# Patient Record
Sex: Female | Born: 2000 | Race: Black or African American | Hispanic: No | Marital: Single | Smoking: Current every day smoker
Health system: Southern US, Community
[De-identification: ages and names within clinical notes are randomized; demographics above are authoritative.]

## PROBLEM LIST (undated history)

## (undated) HISTORY — PX: TONSILLECTOMY: SUR1361

---
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
## 2020-06-13 ENCOUNTER — Ambulatory Visit (HOSPITAL_COMMUNITY)
Admission: EM | Admit: 2020-06-13 | Discharge: 2020-06-14 | Disposition: A | Discharge status: Home / Self Care | Payer: 59 | Attending: Nurse Practitioner | Admitting: Nurse Practitioner

## 2020-06-13 ENCOUNTER — Other Ambulatory Visit: Payer: Self-pay

## 2020-06-13 DIAGNOSIS — Z20822 Contact with and (suspected) exposure to covid-19: Secondary | ICD-10-CM | POA: Insufficient documentation

## 2020-06-13 DIAGNOSIS — Z9151 Personal history of suicidal behavior: Secondary | ICD-10-CM | POA: Insufficient documentation

## 2020-06-13 DIAGNOSIS — Z733 Stress, not elsewhere classified: Secondary | ICD-10-CM | POA: Insufficient documentation

## 2020-06-13 DIAGNOSIS — F332 Major depressive disorder, recurrent severe without psychotic features: Secondary | ICD-10-CM | POA: Insufficient documentation

## 2020-06-13 NOTE — BH Assessment (Addendum)
Comprehensive Clinical Assessment (CCA) Note  06/14/2020 Darlene King [id number]   Disposition: [name], NP, recommends overnight observation for safety and stabilization with psych reassessment in the AM.   The patient demonstrates the following risk factors for suicide: Chronic risk factors for suicide include: previous suicide attempts attempted overdose 2 years ago. Acute risk factors for suicide include: social withdrawal/isolation. Protective factors for this patient include: positive social support and hope for the future. Considering these factors, the overall suicide risk at this point appears to be high. Patient is appropriate for outpatient follow up.  No prior history populated.   1:1 high risk sitter  Darlene King is a 19 year old female presenting voluntarily to [facility] due to SI with plan to cut self with razor. Patient reported being SI within the past 2 hours. Patient reported calling Suicide Line and sharing with them feelings of depression and SI. Police were notified and brought patient into [facility]. Patient reported onset was 05/15/20, grief/loss after abortion. Patient reported parents and friends new about abortion and are supportive. Patient reported worsening depressive symptoms. Patient reported suicide attempt 2 years ago of attempted overdose. Patient denied prior inpatient psych hospitalizations. Patient denied receiving outpatient mental health treatment and is not received any prescription medications.   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 Patient reported good grades in school. Patient currently lives on campus with 1 roommate. Patient reported getting along well with family members. Patient denied access to guns. Patient was cooperative during assessment.   Chief Complaint:  Chief Complaint  Patient presents with  . Suicidal   Visit Diagnosis: Major depressive disorder  CCA Screening, Triage and  Referral (STR)  Patient Reported Information How did you hear about [place]? School/University  Referral name: No data recorded Referral phone number: No data recorded  Whom do you see for routine medical problems? No data recorded Practice/Facility Name: No data recorded Practice/Facility Phone Number: No data recorded Name of Contact: No data recorded Contact Number: No data recorded Contact Fax Number: No data recorded Prescriber Name: No data recorded Prescriber Address (if known): No data recorded  What Is the Reason for Your Visit/Call Today? SI with plan to cut self with razor.  How Long Has This Been Causing You Problems? 1 wk - 1 month  What Do You Feel Would Help You the Most Today? Treatment for Depression or other mood problem  Have You Recently Been in Any Inpatient Treatment (Hospital/Detox/Crisis Center/28-Day Program)? No  Name/Location of Program/Hospital:No data recorded How Long Were You There? No data recorded When Were You Discharged? No data recorded  Have You Ever Received Services From [facility] Before? No  Who Do You See at [facility]? No data recorded  Have You Recently Had Any Thoughts About Hurting Yourself? Yes  Are You Planning to Commit Suicide/Harm Yourself At This time? No  Have you Recently Had Thoughts About Hurting Someone Darlene King? No  Explanation: No data recorded  Have You Used Any Alcohol or Drugs in the Past 24 Hours? No  How Long Ago Did You Use Drugs or Alcohol? No data recorded What Did You Use and How Much? No data recorded  Do You Currently Have a Therapist/Psychiatrist? No  Name of Therapist/Psychiatrist: No data recorded  Have You Been Recently Discharged From Any Office Practice or Programs? No  Explanation of Discharge From Practice/Program: No data recorded  CCA Screening Triage Referral Assessment Type of Contact: Face-to-Face  Is this  Initial or Reassessment? No data recorded Date Telepsych consult ordered in  [facility]:  No data recorded Time Telepsych consult ordered in [facility]:  No data recorded  Patient Reported Information Reviewed? Yes  Patient Left Without Being Seen? No data recorded Reason for Not Completing Assessment: No data recorded  Collateral Involvement: none reported  Does Patient Have a Court Appointed Legal Guardian? No data recorded Name and Contact of Legal Guardian: No data recorded If Minor and Not Living with Parent(s), Who has Custody? No data recorded Is CPS involved or ever been involved? Never  Is APS involved or ever been involved? Never  Patient Determined To Be At Risk for Harm To Self or Others Based on Review of Patient Reported Information or Presenting Complaint? Yes, for Self-Harm  Method: No data recorded Availability of Means: No data recorded Intent: No data recorded Notification Required: No data recorded Additional Information for Danger to Others Potential: No data recorded Additional Comments for Danger to Others Potential: No data recorded Are There Guns or Other Weapons in Your Home? No data recorded Types of Guns/Weapons: No data recorded Are These Weapons Safely Secured?                            No data recorded Who Could Verify You Are Able To Have These Secured: No data recorded Do You Have any Outstanding Charges, Pending Court Dates, Parole/Probation? No data recorded Contacted To Inform of Risk of Harm To Self or Others: No data recorded  [facility]  Does Patient Present under Involuntary Commitment? No  IVC Papers Initial File Date: No data recorded  [place]  Patient Currently Receiving the Following Services: Not Receiving Services  Determination of Need: Urgent (48 hours)  Options For Referral: Outpatient Therapy; Medication Management  CCA Biopsychosocial Intake/Chief Complaint:  SI with plan to cut self with razor.  Current Symptoms/Problems: Worsening depressive  symptoms.  Patient Reported Schizophrenia/Schizoaffective Diagnosis in Past: No  Strengths: self-awareness  Preferences: No data recorded Abilities: No data recorded  Type of Services Patient Feels are Needed: outpatient therapy  Initial Clinical Notes/Concerns: No data recorded  Mental Health Symptoms Depression:  Hopelessness; Fatigue; Change in energy/activity; Sleep (too much or little); Tearfulness; Worthlessness; Increase/decrease in appetite   Duration of Depressive symptoms: Greater than two weeks   Mania:  None   Anxiety:   Worrying   Psychosis:  None   Duration of Psychotic symptoms: No data recorded  Trauma:  Guilt/shame   Obsessions:  None   Compulsions:  None   Inattention:  None   Hyperactivity/Impulsivity:  N/A   Oppositional/Defiant Behaviors:  None   Emotional Irregularity:  None   Other Mood/Personality Symptoms:  No data recorded   Mental Status Exam Appearance and self-care  Stature:  Average   Weight:  Average weight   Clothing:  Neat/clean   Grooming:  Normal   Cosmetic use:  Age appropriate   Posture/gait:  Normal   Motor activity:  Not Remarkable   Sensorium  Attention:  Normal   Concentration:  Normal   Orientation:  X5   Recall/memory:  Normal   Affect and Mood  Affect:  Appropriate; Anxious   Mood:  Depressed; Hopeless; Worthless   Relating  Eye contact:  Normal   Facial expression:  Depressed   Attitude toward examiner:  Cooperative   Thought and Language  Speech flow: Normal   Thought content:  Appropriate to Mood and Circumstances   Preoccupation:  None   Hallucinations:  None   Organization:  No data recorded  [employer] of Knowledge:  Average   Intelligence:  Average   Abstraction:  Normal   Judgement:  Normal   Reality Testing:  Adequate   Insight:  Fair   Decision Making:  No data recorded  Social Functioning  Social Maturity:  No data recorded  Social Judgement:   Normal   Stress  Stressors:  Grief/losses; Transitions; Other (Comment) (people)   Coping Ability:  Exhausted; Overwhelmed   Skill Deficits:  Self-control; Communication   Supports:  Family; Friends/Service system    Religion:   Leisure/Recreation: Leisure / Recreation Do You Have Hobbies?: Yes Leisure and Hobbies: painting, Microbiologist.  Exercise/Diet: Exercise/Diet Do You Follow a Special Diet?: No Do You Have Any Trouble Sleeping?: Yes Explanation of Sleeping Difficulties: 4-5  CCA Employment/Education Employment/Work Situation: Employment / Work Situation Employment situation: Surveyor, minerals job has been impacted by current illness: No Has patient ever been in the [employer]?: No  Education: Education Is Patient Currently Attending School?: Yes School Currently Attending: [employer] Last Grade Completed: 14 Name of [employer] School: n/a Did Garment/textile technologist From [employer]?: Yes Did Theme park manager?: Yes What Type of College Degree Do you Have?: [employer] Did [place] Attend Graduate School?: No 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, broadcasting/film/video Did You Have An Individualized Education Program (IIEP): No Did You Have Any Difficulty At [employer]?: No Patient's Education Has Been Impacted by Current Illness: No  CCA Family/Childhood History Family and Relationship History: Family history Does patient have children?: No  Childhood History:  Childhood History Additional childhood history information: uta Description of patient's relationship with caregiver when they were a child: good Patient's description of current relationship with people who raised him/her: good How were you disciplined when you got in trouble as a child/adolescent?: n/a Does patient have siblings?: Yes Number of Siblings: 2 Description of patient's current relationship with siblings: good Did patient suffer any verbal/emotional/physical/sexual abuse as a child?: No Did patient  suffer from severe childhood neglect?: No Has patient ever been sexually abused/assaulted/raped as an adolescent or adult?: No Was the patient ever a victim of a crime or a disaster?: No Witnessed domestic violence?: No Has patient been affected by domestic violence as an adult?: No  Child/Adolescent Assessment:   CCA Substance Use Alcohol/Drug Use: Alcohol / Drug Use Pain Medications: see MAR Prescriptions: see MAR Over the Counter: see MAR History of alcohol / drug use?: No history of alcohol / drug abuse   ASAM's:  Six Dimensions of Multidimensional Assessment  Dimension 1:  Acute Intoxication and/or Withdrawal Potential:      Dimension 2:  Biomedical Conditions and Complications:      Dimension 3:  Emotional, Behavioral, or Cognitive Conditions and Complications:     Dimension 4:  Readiness to Change:     Dimension 5:  Relapse, Continued use, or Continued Problem Potential:     Dimension 6:  Recovery/Living Environment:     ASAM Severity Score:    ASAM Recommended Level of Treatment:     Substance use Disorder (SUD)   Recommendations for Services/Supports/Treatments: Recommendations for Services/Supports/Treatments Recommendations For Services/Supports/Treatments: Medication Management,Individual Therapy  DSM5 Diagnoses: Patient Active Problem List   Diagnosis Date Noted  . Current moderate episode of major depressive disorder without prior episode (HCC)   . Nausea and vomiting 05/28/2019  . Nausea & vomiting  05/27/2019  . Normocytic anemia 05/27/2019   Patient Centered Plan: Patient is on the following Treatment Plan(s):   Referrals to Alternative Service(s): Referred to Alternative Service(s):   Place:   Date:   Time:    Referred to Alternative Service(s):   Place:   Date:   Time:    Referred to Alternative Service(s):   Place:   Date:   Time:    Referred to Alternative Service(s):   Place:   Date:   Time:     Burnetta Sabin, [facility]

## 2020-06-14 DIAGNOSIS — Z733 Stress, not elsewhere classified: Secondary | ICD-10-CM | POA: Diagnosis not present

## 2020-06-14 DIAGNOSIS — Z9151 Personal history of suicidal behavior: Secondary | ICD-10-CM | POA: Diagnosis not present

## 2020-06-14 DIAGNOSIS — R45851 Suicidal ideations: Secondary | ICD-10-CM | POA: Diagnosis present

## 2020-06-14 DIAGNOSIS — Z20822 Contact with and (suspected) exposure to covid-19: Secondary | ICD-10-CM | POA: Diagnosis not present

## 2020-06-14 DIAGNOSIS — F332 Major depressive disorder, recurrent severe without psychotic features: Secondary | ICD-10-CM | POA: Diagnosis not present

## 2020-06-14 DIAGNOSIS — F321 Major depressive disorder, single episode, moderate: Secondary | ICD-10-CM

## 2020-06-14 LAB — CBC WITH DIFFERENTIAL/PLATELET
Abs Immature Granulocytes: 0.01 10*3/uL (ref 0.00–0.07)
Basophils Absolute: 0 10*3/uL (ref 0.0–0.1)
Basophils Relative: 1 %
Eosinophils Absolute: 0.1 10*3/uL (ref 0.0–0.5)
Eosinophils Relative: 1 %
HCT: 40.9 % (ref 36.0–46.0)
Hemoglobin: 12.7 g/dL (ref 12.0–15.0)
Immature Granulocytes: 0 %
Lymphocytes Relative: 53 %
Lymphs Abs: 3.7 10*3/uL (ref 0.7–4.0)
MCH: 24.4 pg — ABNORMAL LOW (ref 26.0–34.0)
MCHC: 31.1 g/dL (ref 30.0–36.0)
MCV: 78.7 fL — ABNORMAL LOW (ref 80.0–100.0)
Monocytes Absolute: 0.5 10*3/uL (ref 0.1–1.0)
Monocytes Relative: 8 %
Neutro Abs: 2.5 10*3/uL (ref 1.7–7.7)
Neutrophils Relative %: 37 %
Platelets: 361 10*3/uL (ref 150–400)
RBC: 5.2 MIL/uL — ABNORMAL HIGH (ref 3.87–5.11)
RDW: 14.7 % (ref 11.5–15.5)
WBC: 6.8 10*3/uL (ref 4.0–10.5)
nRBC: 0 % (ref 0.0–0.2)

## 2020-06-14 LAB — POCT URINE DRUG SCREEN - MANUAL ENTRY (I-SCREEN)
POC Amphetamine UR: NOT DETECTED
POC Buprenorphine (BUP): NOT DETECTED
POC Cocaine UR: NOT DETECTED
POC Marijuana UR: POSITIVE — AB
POC Methadone UR: NOT DETECTED
POC Methamphetamine UR: NOT DETECTED
POC Morphine: NOT DETECTED
POC Oxazepam (BZO): NOT DETECTED
POC Oxycodone UR: NOT DETECTED
POC Secobarbital (BAR): NOT DETECTED

## 2020-06-14 LAB — COMPREHENSIVE METABOLIC PANEL
ALT: <5 U/L (ref 0–44)
AST: 22 U/L (ref 15–41)
Albumin: 4.3 g/dL (ref 3.5–5.0)
Alkaline Phosphatase: 39 U/L (ref 38–126)
Anion gap: 9 (ref 5–15)
BUN: 9 mg/dL (ref 6–20)
CO2: 24 mmol/L (ref 22–32)
Calcium: 9.9 mg/dL (ref 8.9–10.3)
Chloride: 103 mmol/L (ref 98–111)
Creatinine, Ser: 0.75 mg/dL (ref 0.44–1.00)
GFR, Estimated: >60 mL/min (ref >60)
Glucose, Bld: 76 mg/dL (ref 70–99)
Potassium: 4.1 mmol/L (ref 3.5–5.1)
Sodium: 136 mmol/L (ref 135–145)
Total Bilirubin: 0.1 mg/dL — ABNORMAL LOW (ref 0.3–1.2)

## 2020-06-14 LAB — LIPID PANEL
Cholesterol: 194 mg/dL (ref 0–200)
HDL: 80 mg/dL (ref >40)
LDL Cholesterol: 103 mg/dL — ABNORMAL HIGH (ref 0–99)
Total CHOL/HDL Ratio: 2.4 RATIO
Triglycerides: 54 mg/dL (ref <150)
VLDL: 11 mg/dL (ref 0–40)

## 2020-06-14 LAB — ETHANOL: Alcohol, Ethyl (B): <10 mg/dL (ref <10)

## 2020-06-14 LAB — POCT PREGNANCY, URINE: Preg Test, Ur: NEGATIVE

## 2020-06-14 LAB — HEMOGLOBIN A1C
Hgb A1c MFr Bld: 5.4 % (ref 4.8–5.6)
Mean Plasma Glucose: 108.28 mg/dL

## 2020-06-14 LAB — RESP PANEL BY RT-PCR (FLU A&B, COVID) ARPGX2
Influenza A by PCR: NEGATIVE
Influenza B by PCR: NEGATIVE
SARS Coronavirus 2 by RT PCR: NEGATIVE

## 2020-06-14 LAB — POC SARS CORONAVIRUS 2 AG: SARS Coronavirus 2 Ag: NEGATIVE

## 2020-06-14 LAB — TSH: TSH: 2.12 u[IU]/mL (ref 0.350–4.500)

## 2020-06-14 MED ORDER — ACETAMINOPHEN 325 MG PO TABS: 650.0000 mg | ORAL_TABLET | Freq: Four times a day (QID) | ORAL | Status: DC | PRN

## 2020-06-14 MED ORDER — TRAZODONE HCL 50 MG PO TABS: 50.0000 mg | ORAL_TABLET | Freq: Every evening | ORAL | Status: DC | PRN

## 2020-06-14 MED ORDER — MAGNESIUM HYDROXIDE 400 MG/5ML PO SUSP: 30.0000 mL | Freq: Every day | ORAL | Status: DC | PRN

## 2020-06-14 MED ORDER — ALUM & MAG HYDROXIDE-SIMETH 200-200-20 MG/5ML PO SUSP: 30.0000 mL | ORAL | Status: DC | PRN

## 2020-06-14 MED ORDER — HYDROXYZINE HCL 25 MG PO TABS: 25.0000 mg | ORAL_TABLET | Freq: Three times a day (TID) | ORAL | Status: DC | PRN

## 2020-06-14 NOTE — BHH Counselor (Addendum)
Darlene King is a 20 year old female presenting voluntarily to [facility] due to SI with plan to cut self with razor. Patient reported being SI within the past 2 hours. Patient reported calling Suicide Line and sharing with them feelings of depression and SI. Police were notified and brought patient into [facility]. Patient reported onset was 05/15/20, grief/loss after abortion. Patient reported parents and friends new about abortion and are supportive. Patient reported worsening depressive symptoms. Patient reported suicide attempt 2 years ago of attempted overdose. Patient denied prior inpatient psych hospitalizations. Patient denied receiving outpatient mental health treatment and is not received any prescription medications.   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 Patient reported good grades in school. Patient currently lives on campus with 1 roommate. Patient reported getting along well with family members. Patient denied access to guns. Patient was cooperative during assessment.  Urgent

## 2020-06-14 NOTE — ED Notes (Signed)
Patient cooperative with admission process.  Teary.  Denied SI, HI, AVH.  Stated anxiety was an 8/10.  Did not rate depression.  Patient ambulated to unit independently.  Given access to a phone and offered food and fluids.

## 2020-06-14 NOTE — ED Notes (Signed)
Belongings in locker #27  

## 2020-06-14 NOTE — Progress Notes (Signed)
Patient denies SI, HI and AVH. Patient pleasant in demeanor received AVS, questions answered. Patient D/C home with friend Debby Bud.

## 2020-06-14 NOTE — ED Provider Notes (Signed)
FBC/OBS ASAP Discharge Summary  Date and Time: 06/14/2020 10:37 AM  Name: Darlene King  MRN:  [id number]   Discharge Diagnoses:  Final diagnoses:  Severe recurrent major depression without psychotic features [facility])    Subjective: Patient reports today that she is doing much better that she has some sleep.  Patient denies any suicidal or homicidal ideations and denies any hallucinations.  Patient reports that she was just having a bad day yesterday and was thinking about the abortion.  She also reports that she has been dealing with some stress in school as she is a sophomore at [employer].  She reports that she lives on campus in the dorm and has some very close friends.  She reports that her family lives back in [place].  She reports that she feels that she is ready to discharge home today and that she is not interested in having any medications but definitely needs to have some therapy.  She provides me with the number for Debby Bud who is been her friend for the last 3 to 4 years and he can be reached at [phone]. Debby Bud was contacted for collateral information and safety planning.  He reports that he has no safety concerns with the patient discharging home.  He states that he feels that she is just got overwhelmed with school as well as the recent abortion and states that he can be with her and supportive for her if she is discharged and he can stay with her as well.  He reports that he will pick the patient up from the facility.  Stay Summary: Patient is a 20 year old female who presented with a history of depression voluntarily [facility] with law enforcement after contacting the suicide hotline.  Plan to cut herself with a razor.  Patient had an abortion on 05/09/2020 and states that she was feeling guilty about her actions.  She also reported having a suicide attempt 2 years ago by overdose.  She was admitted to the continuous observation unit for overnight assessment.  Today the patient is  reporting she is feeling much better than she was having a safe place to stay.  She states that she is not interested in any medications and is only interested in starting therapy.  Safety planning and collateral information was established with her friend Debby Bud.  There were no safety concerns with the patient discharging and he stated he will pick the patient up.  Patient was provided with outpatient resources for therapy was also instructed to follow-up at the college student resources.  Patient was discharged to her friend, Debby Bud  Total Time spent with patient: 30 minutes  Past Psychiatric History: None reported Past Medical History: No past medical history on file.  Past Surgical History:  Procedure Laterality Date  . TONSILLECTOMY     Family History:  Family History  Problem Relation Age of Onset  . Prostate cancer Paternal Grandfather    Family Psychiatric History: None reported Social History:  Social History   Substance and Sexual Activity  Alcohol Use Never     Social History   Substance and Sexual Activity  Drug Use Yes  . Types: Marijuana    Social History   Socioeconomic History  . Marital status: Single    Spouse name: Not on file  . Number of children: Not on file  . Years of education: Not on file  . Highest education level: Not on file  Occupational History  . Not on file  Tobacco Use  .  Smoking status: Never Smoker  . Smokeless tobacco: Never Used  Vaping Use  . Vaping Use: Never used  Substance and Sexual Activity  . Alcohol use: Never  . Drug use: Yes    Types: Marijuana  . Sexual activity: Not on file  Other Topics Concern  . Not on file  Social History Narrative  . Not on file   Social Determinants of Health   Financial Resource Strain: Not on file  Food Insecurity: Not on file  Transportation Needs: Not on file  Physical Activity: Not on file  Stress: Not on file  Social Connections: Not on file   SDOH:  SDOH Screenings   Alcohol  Screen: Not on file  Depression (PHQ2-9): Not on file  Financial Resource Strain: Not on file  Food Insecurity: Not on file  Housing: Not on file  Physical Activity: Not on file  Social Connections: Not on file  Stress: Not on file  Tobacco Use: Not on file  Transportation Needs: Not on file    Has this patient used any form of tobacco in the last 30 days? (Cigarettes, Smokeless Tobacco, Cigars, and/or Pipes) A prescription for an FDA-approved tobacco cessation medication was offered at discharge and the patient refused  Current Medications:  Current Facility-Administered Medications  Medication Dose Route Frequency Provider Last Rate Last Admin  . acetaminophen (TYLENOL) tablet 650 mg  650 mg Oral Q6H PRN Jackelyn Poling, NP      . alum & mag hydroxide-simeth (MAALOX/MYLANTA) 200-200-20 MG/5ML suspension 30 mL  30 mL Oral Q4H PRN [name] A, NP      . hydrOXYzine (ATARAX/VISTARIL) tablet 25 mg  25 mg Oral TID PRN [name] A, NP      . magnesium hydroxide (MILK OF MAGNESIA) suspension 30 mL  30 mL Oral Daily PRN [name] A, NP      . traZODone (DESYREL) tablet 50 mg  50 mg Oral QHS PRN Jackelyn Poling, NP       Current Outpatient Medications  Medication Sig Dispense Refill  . acetaminophen (TYLENOL) 325 MG tablet Take 2 tablets (650 mg total) by mouth every 6 (six) hours as needed for mild pain (or Fever >/= 101).    . norelgestromin-ethinyl estradiol (ZAFEMY) 150-35 MCG/24HR transdermal patch Place 1 patch onto the skin once a week.      PTA Medications: (Not in a hospital admission)   Musculoskeletal  Strength & Muscle Tone: within normal limits Gait & Station: normal Patient leans: N/A  Psychiatric Specialty Exam  Presentation  General Appearance: Appropriate for Environment; Casual  Eye Contact:Good  Speech:Clear and Coherent; Normal Rate  Speech Volume:Normal  Handedness:Right   Mood and Affect  Mood:Anxious  Affect:Appropriate;  Congruent   Thought Process  Thought Processes:Coherent  Descriptions of Associations:Intact  Orientation:Full (Time, Place and Person)  Thought Content:WDL  Diagnosis of Schizophrenia or Schizoaffective disorder in past: No    Hallucinations:Hallucinations: None  Ideas of Reference:None  Suicidal Thoughts:Suicidal Thoughts: No  Homicidal Thoughts:Homicidal Thoughts: No   Sensorium  Memory:Immediate Good; Recent Good; Remote Good  Judgment:Fair  Insight:Fair   Executive Functions  Concentration:Good  Attention Span:Good  Recall:Good  Fund of Knowledge:Good  Language:Good   Psychomotor Activity  Psychomotor Activity:Psychomotor Activity: Normal   Assets  Assets:Communication Skills; Desire for Improvement; Financial Resources/Insurance; Housing; Physical Health; Social Support; Transportation   Sleep  Sleep:Sleep: Good   Nutritional Assessment (For OBS and [facility] admissions only) Has the patient had a weight loss or gain of  10 pounds or more in the last 3 months?: No Has the patient had a decrease in food intake/or appetite?: Yes Does the patient have dental problems?: No Does the patient have eating habits or behaviors that may be indicators of an eating disorder including binging or inducing vomiting?: No Has the patient recently lost weight without trying?: No Has the patient been eating poorly because of a decreased appetite?: Yes Malnutrition Screening Tool Score: 1    Physical Exam  Physical Exam Vitals and nursing note reviewed.  Constitutional:      Appearance: She is well-developed.  HENT:     Head: Normocephalic.  Eyes:     Pupils: Pupils are equal, round, and reactive to light.  Cardiovascular:     Rate and Rhythm: Normal rate.  Pulmonary:     Effort: Pulmonary effort is normal.  Musculoskeletal:        General: Normal range of motion.  Neurological:     Mental Status: She is alert and oriented to person, place, and time.     Review of Systems  Constitutional: Negative.   HENT: Negative.   Eyes: Negative.   Respiratory: Negative.   Cardiovascular: Negative.   Gastrointestinal: Negative.   Genitourinary: Negative.   Musculoskeletal: Negative.   Skin: Negative.   Neurological: Negative.   Endo/Heme/Allergies: Negative.   Psychiatric/Behavioral: Negative.    Blood pressure 107/72, pulse 66, temperature 98.6 F (37 C), temperature source Oral, resp. rate 16, SpO2 100 %. There is no height or weight on file to calculate BMI.  Demographic Factors:  Adolescent or young adult  Loss Factors: Loss of child to abortion  Historical Factors: NA  Risk Reduction Factors:   Sense of responsibility to family, Living with another person, especially a relative and Positive social support  Continued Clinical Symptoms:  Previous Psychiatric Diagnoses and Treatments  Cognitive Features That Contribute To Risk:  None    Suicide Risk:  Mild:  Suicidal ideation of limited frequency, intensity, duration, and specificity.  There are no identifiable plans, no associated intent, mild dysphoria and related symptoms, good self-control (both objective and subjective assessment), few other risk factors, and identifiable protective factors, including available and accessible social support.  Plan Of Care/Follow-up recommendations:  Continue activity as tolerated. Continue diet as recommended by your PCP. Ensure to keep all appointments with outpatient providers.  Disposition: Discharge to dorm with close friend  Maryfrances Bunnell, FNP 06/14/2020, 10:37 AM

## 2020-06-14 NOTE — Progress Notes (Signed)
Patient resting, respirations even and unlabored, no objective signs of discomfort. Nursing staff will continue to monitor.

## 2020-06-14 NOTE — ED Provider Notes (Signed)
Behavioral Health Admission H&P [facility] & OBS)  Date: 06/14/20 Patient Name: Darlene King MRN: [id number] Chief Complaint:  Chief Complaint  Patient presents with  . Suicidal   Chief Complaint/Presenting Problem: SI with plan to cut self with razor.  Diagnoses:  Final diagnoses:  Severe recurrent major depression without psychotic features (HCC)    HPI: Darlene King is 20 y.o. female with a history of depression who presents to [facility] voluntarily with law enforcement after contacting a suicide hotline and reporting that she was suicidal with a plan to cut herself with a razor. Patient reports that she had an abortion of December 14, 2000 and that she has been feeling guilty and depressed since then. She reports a suicide attempt two years ago by overdose.   On evaluation patient is alert and oriented x 4, pleasant, and cooperative. Speech is clear and coherent, decreased in volume. Mood is depressed and affect is congruent with mood.Tearful at times. Thought process is coherent and thought content is logical. Denies auditory and visual hallucinations. No indication that patient is responding to internal stimuli. No evidence of delusional thought content. Denies current suicidal ideations. Denies homicidal ideations. Reports daily use of marijuana. Denies use of alcohol and other substances.   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 Patient reported good grades in school. Patient currently lives on campus with 1 roommate. Patient reported getting along well with family members. Patient denied access to guns.   PHQ 2-9:     Total Time spent with patient: 30 minutes  Musculoskeletal  Strength & Muscle Tone: within normal limits Gait & Station: normal Patient leans: N/A  Psychiatric Specialty Exam  Presentation General Appearance: Appropriate for Environment; Neat  Eye Contact:Fair  Speech:Clear and Coherent; Normal Rate  Speech  Volume:Decreased  Handedness:No data recorded  Mood and Affect  Mood:Anxious; Depressed  Affect:Depressed; Tearful; Congruent   Thought Process  Thought Processes:Coherent  Descriptions of Associations:Intact  Orientation:Full (Time, Place and Person)  Thought Content:Logical  Diagnosis of Schizophrenia or Schizoaffective disorder in past: No   Hallucinations:Hallucinations: None  Ideas of Reference:None  Suicidal Thoughts:Suicidal Thoughts: No  Homicidal Thoughts:Homicidal Thoughts: No   Sensorium  Memory:Immediate Good; Recent Good  Judgment:Impaired  Insight:Lacking   Executive Functions  Concentration:Fair  Attention Span:Fair  Recall:Good  Fund of Knowledge:Good  Language:Good   Psychomotor Activity  Psychomotor Activity:Psychomotor Activity: Normal   Assets  Assets:Communication Skills; Desire for Improvement; Financial Resources/Insurance; Housing; Physical Health; Resilience; Transportation   Sleep  Sleep:Sleep: Fair   Nutritional Assessment (For OBS and FBC admissions only) Has the patient had a weight loss or gain of 10 pounds or more in the last 3 months?: No Has the patient had a decrease in food intake/or appetite?: Yes Does the patient have dental problems?: No Does the patient have eating habits or behaviors that may be indicators of an eating disorder including binging or inducing vomiting?: No Has the patient recently lost weight without trying?: No Has the patient been eating poorly because of a decreased appetite?: Yes Malnutrition Screening Tool Score: 1    Physical Exam Constitutional:      General: She is not in acute distress.    Appearance: She is not ill-appearing, toxic-appearing or diaphoretic.  HENT:     Head: Normocephalic.     Right Ear: External ear normal.     Left Ear: External ear normal.  Eyes:     Conjunctiva/sclera: Conjunctivae normal.     Pupils: Pupils are  equal, round, and reactive to light.   Cardiovascular:     Rate and Rhythm: Normal rate.  Pulmonary:     Effort: Pulmonary effort is normal. No respiratory distress.  Musculoskeletal:        General: Normal range of motion.  Skin:    General: Skin is warm and dry.  Neurological:     Mental Status: She is alert and oriented to person, place, and time.  Psychiatric:        Mood and Affect: Mood is anxious and depressed.        Thought Content: Thought content is not paranoid or delusional. Thought content does not include homicidal or suicidal ideation. Thought content does not include suicidal plan.    Review of Systems  Constitutional: Negative for chills, diaphoresis, fever, malaise/fatigue and weight loss.  HENT: Negative for congestion.   Respiratory: Negative for cough and shortness of breath.   Cardiovascular: Negative for chest pain and palpitations.  Gastrointestinal: Negative for diarrhea, nausea and vomiting.  Neurological: Negative for dizziness and seizures.  Psychiatric/Behavioral: Positive for depression, substance abuse (marijuana) and suicidal ideas. Negative for hallucinations and memory loss. The patient is nervous/anxious. The patient does not have insomnia.   All other systems reviewed and are negative.   Blood pressure 121/81, pulse 75, temperature 98.6 F (37 C), temperature source Oral, resp. rate 16, SpO2 100 %. There is no height or weight on file to calculate BMI.  Past Psychiatric History: MDD, suicide attempt 2 years ago, denies inpatient psychiatric treatment  Is the patient at risk to self? Yes  Has the patient been a risk to self in the past 6 months? No .    Has the patient been a risk to self within the distant past? Yes   Is the patient a risk to others? No   Has the patient been a risk to others in the past 6 months? No   Has the patient been a risk to others within the distant past? No   Past Medical History: No past medical history on file.  Past Surgical History:  Procedure  Laterality Date  . TONSILLECTOMY      Family History:  Family History  Problem Relation Age of Onset  . Prostate cancer Paternal Grandfather     Social History:  Social History   Socioeconomic History  . Marital status: Single    Spouse name: Not on file  . Number of children: Not on file  . Years of education: Not on file  . Highest education level: Not on file  Occupational History  . Not on file  Tobacco Use  . Smoking status: Never Smoker  . Smokeless tobacco: Never Used  Vaping Use  . Vaping Use: Never used  Substance and Sexual Activity  . Alcohol use: Never  . Drug use: Yes    Types: Marijuana  . Sexual activity: Not on file  Other Topics Concern  . Not on file  Social History Narrative  . Not on file   Social Determinants of Health   Financial Resource Strain: Not on file  Food Insecurity: Not on file  Transportation Needs: Not on file  Physical Activity: Not on file  Stress: Not on file  Social Connections: Not on file  Intimate Partner Violence: Not on file    SDOH:  SDOH Screenings   Alcohol Screen: Not on file  Depression (MHD6-2): Not on file  Financial Resource Strain: Not on file  Food Insecurity: Not on file  Housing: Not on file  Physical Activity: Not on file  Social Connections: Not on file  Stress: Not on file  Tobacco Use: Not on file  Transportation Needs: Not on file    Last Labs:  No visits with results within 6 Month(s) from this visit.  Latest known visit with results is:  Admission on 05/27/2019, Discharged on 05/29/2019  Component Date Value Ref Range Status  . Lipase 05/27/2019 22  11 - 51 U/L Final   Performed at [facility], [address], [place], [place] [address]  . Sodium 05/27/2019 136  135 - 145 mmol/L Final  . Potassium 05/27/2019 3.5  3.5 - 5.1 mmol/L Final  . Chloride 05/27/2019 106  98 - 111 mmol/L Final  . CO2 05/27/2019 17* 22 - 32 mmol/L Final  . Glucose, Bld 05/27/2019 77  70 -  99 mg/dL Final   Glucose reference range applies only to samples taken after fasting for at least 8 hours.  . BUN 05/27/2019 12  6 - 20 mg/dL Final  . Creatinine, Ser 05/27/2019 0.74  0.44 - 1.00 mg/dL Final  . Calcium 51/04/5850 9.3  8.9 - 10.3 mg/dL Final  . Total Protein 05/27/2019 7.7  6.5 - 8.1 g/dL Final  . Albumin 77/82/4235 4.3  3.5 - 5.0 g/dL Final  . AST 36/14/4315 19  15 - 41 U/L Final  . ALT 05/27/2019 12  0 - 44 U/L Final  . Alkaline Phosphatase 05/27/2019 42  38 - 126 U/L Final  . Total Bilirubin 05/27/2019 0.8  0.3 - 1.2 mg/dL Final  . GFR calc non Af Amer 05/27/2019 >60  >60 mL/min Final  . GFR calc Af Amer 05/27/2019 >60  >60 mL/min Final  . Anion gap 05/27/2019 13  5 - 15 Final   Performed at [facility], [address], [place], [place] [address]  . WBC 05/27/2019 7.7  4.0 - 10.5 K/uL Final  . RBC 05/27/2019 4.97  3.87 - 5.11 MIL/uL Final  . Hemoglobin 05/27/2019 11.7* 12.0 - 15.0 g/dL Final  . HCT 76/19/5093 39.9  36.0 - 46.0 % Final  . MCV 05/27/2019 80.3  80.0 - 100.0 fL Final  . MCH 05/27/2019 23.5* 26.0 - 34.0 pg Final  . MCHC 05/27/2019 29.3* 30.0 - 36.0 g/dL Final  . RDW 26/71/2458 15.9* 11.5 - 15.5 % Final  . Platelets 05/27/2019 292  150 - 400 K/uL Final  . nRBC 05/27/2019 0.0  0.0 - 0.2 % Final   Performed at [facility], [address] [place], [place] [address]  . Color, Urine 05/27/2019 YELLOW  YELLOW Final  . APPearance 05/27/2019 HAZY* CLEAR Final  . Specific Gravity, Urine 05/27/2019 1.029  1.005 - 1.030 Final  . pH 05/27/2019 6.0  5.0 - 8.0 Final  . Glucose, UA 05/27/2019 NEGATIVE  NEGATIVE mg/dL Final  . Hgb urine dipstick 05/27/2019 MODERATE* NEGATIVE Final  . Bilirubin Urine 05/27/2019 NEGATIVE  NEGATIVE Final  . Ketones, ur 05/27/2019 80* NEGATIVE mg/dL Final  . Protein, ur 38/25/0539 30* NEGATIVE mg/dL Final  . Nitrite 76/73/4193 NEGATIVE  NEGATIVE Final  . Glori Luis 05/27/2019 NEGATIVE  NEGATIVE  Final  . RBC / HPF 05/27/2019 0-5  0 - 5 RBC/hpf Final  . WBC, UA 05/27/2019 0-5  0 - 5 WBC/hpf Final  . Bacteria, UA 05/27/2019 NONE SEEN  NONE SEEN Final  . Squamous Epithelial / LPF 05/27/2019 11-20  0 - 5 Final  . Mucus 05/27/2019 PRESENT  Final   Performed at [facility], [address], [place], [place] [address]  . hCG, Beta Chain, Quant, S 05/27/2019 <1  <5 mIU/mL Final   Comment:          GEST. AGE      CONC.  (mIU/mL)   <=1 WEEK        5 - 50     2 WEEKS       50 - 500     3 WEEKS       100 - 10,000     4 WEEKS     1,000 - 30,000     5 WEEKS     3,500 - 115,000   6-8 WEEKS     12,000 - 270,000    12 WEEKS     15,000 - 220,000        FEMALE AND NON-PREGNANT FEMALE:     LESS THAN 5 mIU/mL Performed at [facility], [address], [place], [place] [address]   . Opiates 05/27/2019 NONE DETECTED  NONE DETECTED Final  . Cocaine 05/27/2019 NONE DETECTED  NONE DETECTED Final  . Benzodiazepines 05/27/2019 NONE DETECTED  NONE DETECTED Final  . Amphetamines 05/27/2019 NONE DETECTED  NONE DETECTED Final  . Tetrahydrocannabinol 05/27/2019 POSITIVE* NONE DETECTED Final  . Barbiturates 05/27/2019 NONE DETECTED  NONE DETECTED Final   Comment: (NOTE) DRUG SCREEN FOR MEDICAL PURPOSES ONLY.  IF CONFIRMATION IS NEEDED FOR ANY PURPOSE, NOTIFY LAB WITHIN 5 DAYS. LOWEST DETECTABLE LIMITS FOR URINE DRUG SCREEN Drug Class                     Cutoff (ng/mL) Amphetamine and metabolites    1000 Barbiturate and metabolites    200 Benzodiazepine                 200 Tricyclics and metabolites     300 Opiates and metabolites        300 Cocaine and metabolites        300 THC                            50 Performed at [facility], [address] [place], [place] [address]   . SARS Coronavirus 2 Ag 05/27/2019 NEGATIVE  NEGATIVE Final   Comment: (NOTE) SARS-CoV-2 antigen NOT DETECTED.  Negative results are presumptive.  Negative  results do not preclude SARS-CoV-2 infection and should not be used as the sole basis for treatment or other patient management decisions, including infection  control decisions, particularly in the presence of clinical signs and  symptoms consistent with COVID-19, or in those who have been in contact with the virus.  Negative results must be combined with clinical observations, patient history, and epidemiological information. The expected result is Negative. Fact Sheet for Patients: https://sanders-williams.net/ Fact Sheet for Healthcare Providers: https://martinez.com/ This test is not yet approved or cleared by the [place] FDA and  has been authorized for detection and/or diagnosis of SARS-CoV-2 by FDA under an Emergency Use Authorization (EUA).  This EUA will remain in effect (meaning this test can be used) for the duration of  the COVID-19 de                          claration under Section 564(b)(1) of the Act, 21 U.S.C. section 360bbb-3(b)(1), unless the authorization is terminated or revoked sooner.   Marland Kitchen  Magnesium 05/27/2019 2.1  1.7 - 2.4 mg/dL Final   Performed at [facility], [address], [place], [place] [address]  . Phosphorus 05/27/2019 3.0  2.5 - 4.6 mg/dL Final   Performed at [facility], [address], [place], [place] [address]  . HIV Screen 4th Generation wRfx 05/28/2019 NON REACTIVE  NON REACTIVE Final   Performed at [facility], [address] [place], [place] [address]  . WBC 05/28/2019 8.4  4.0 - 10.5 K/uL Final  . RBC 05/28/2019 4.07  3.87 - 5.11 MIL/uL Final  . Hemoglobin 05/28/2019 9.6* 12.0 - 15.0 g/dL Final  . HCT 86/57/8469 33.3* 36.0 - 46.0 % Final  . MCV 05/28/2019 81.8  80.0 - 100.0 fL Final  . MCH 05/28/2019 23.6* 26.0 - 34.0 pg Final  . MCHC 05/28/2019 28.8* 30.0 - 36.0 g/dL Final  . RDW 62/95/2841 15.9* 11.5 - 15.5 % Final  . Platelets 05/28/2019 338  150 -  400 K/uL Final  . nRBC 05/28/2019 0.0  0.0 - 0.2 % Final   Performed at [facility], [address] [place], [place] [address]  . Sodium 05/28/2019 132* 135 - 145 mmol/L Final  . Potassium 05/28/2019 4.4  3.5 - 5.1 mmol/L Final   DELTA CHECK NOTED  . Chloride 05/28/2019 112* 98 - 111 mmol/L Final  . CO2 05/28/2019 12* 22 - 32 mmol/L Final  . Glucose, Bld 05/28/2019 62* 70 - 99 mg/dL Final   Glucose reference range applies only to samples taken after fasting for at least 8 hours.  . BUN 05/28/2019 7  6 - 20 mg/dL Final  . Creatinine, Ser 05/28/2019 0.67  0.44 - 1.00 mg/dL Final  . Calcium 01/13/2535 8.7* 8.9 - 10.3 mg/dL Final  . Total Protein 05/28/2019 6.4* 6.5 - 8.1 g/dL Final  . Albumin 64/40/3474 3.6  3.5 - 5.0 g/dL Final  . AST 25/95/6387 15  15 - 41 U/L Final  . ALT 05/28/2019 11  0 - 44 U/L Final  . Alkaline Phosphatase 05/28/2019 36* 38 - 126 U/L Final  . Total Bilirubin 05/28/2019 0.8  0.3 - 1.2 mg/dL Final  . GFR calc non Af Amer 05/28/2019 >60  >60 mL/min Final  . GFR calc Af Amer 05/28/2019 >60  >60 mL/min Final  . Anion gap 05/28/2019 8  5 - 15 Final   Performed at [facility], [address] [place], [place] [address]  . Sodium 05/29/2019 134* 135 - 145 mmol/L Final  . Potassium 05/29/2019 3.9  3.5 - 5.1 mmol/L Final  . Chloride 05/29/2019 111  98 - 111 mmol/L Final  . CO2 05/29/2019 14* 22 - 32 mmol/L Final  . Glucose, Bld 05/29/2019 69* 70 - 99 mg/dL Final   Glucose reference range applies only to samples taken after fasting for at least 8 hours.  . BUN 05/29/2019 6  6 - 20 mg/dL Final  . Creatinine, Ser 05/29/2019 0.83  0.44 - 1.00 mg/dL Final  . Calcium 29/51/8841 8.8* 8.9 - 10.3 mg/dL Final  . Total Protein 05/29/2019 6.6  6.5 - 8.1 g/dL Final  . Albumin 66/08/3014 3.7  3.5 - 5.0 g/dL Final  . AST 03/27/3233 13* 15 - 41 U/L Final  . ALT 05/29/2019 11  0 - 44 U/L Final  . Alkaline Phosphatase 05/29/2019 37* 38 - 126 U/L  Final  . Total Bilirubin 05/29/2019 0.9  0.3 - 1.2 mg/dL Final  . GFR calc  non Af Amer 05/29/2019 >60  >60 mL/min Final  . GFR calc Af Amer 05/29/2019 >60  >60 mL/min Final  . Anion gap 05/29/2019 9  5 - 15 Final   Performed at [facility], [address] [place], [place] [address]  . WBC 05/29/2019 5.8  4.0 - 10.5 K/uL Final  . RBC 05/29/2019 4.10  3.87 - 5.11 MIL/uL Final  . Hemoglobin 05/29/2019 9.6* 12.0 - 15.0 g/dL Final  . HCT 60/45/409803/02/2020 32.0* 36.0 - 46.0 % Final  . MCV 05/29/2019 78.0* 80.0 - 100.0 fL Final  . MCH 05/29/2019 23.4* 26.0 - 34.0 pg Final  . MCHC 05/29/2019 30.0  30.0 - 36.0 g/dL Final  . RDW 11/91/478203/02/2020 16.1* 11.5 - 15.5 % Final  . Platelets 05/29/2019 241  150 - 400 K/uL Final  . nRBC 05/29/2019 0.0  0.0 - 0.2 % Final   Performed at [facility], [address] [place], [place] [address]    Allergies: Patient has no known allergies.  PTA Medications: (Not in a hospital admission)   Medical Decision Making  Admission orders placed   Clinical Course as of 06/14/20 0629  Tue Jun 14, 2020  0224 POCT Urine Drug Screen - (ICup)(!) UDS positive for marijuana otherwise negative [JB]  0224 Preg Test, Ur: NEGATIVE [JB]  0628 Hemoglobin A1C: 5.4 [JB]  0628 Comprehensive metabolic panel(!) CMP essentially normal [JB]  0628 TSH: 2.120 [JB]    Clinical Course User Index [JB] Jackelyn PolingBerry, Saharah Sherrow A, NP    Recommendations  Based on my evaluation the patient does not appear to have an emergency medical condition.   Patient denies current SI. However, patient acknowledges contacting suicide hotline and informing them that she was suicidal with a plan. Patient will be placed in continuous assessment and reevaluated on 06/14/2020.   Jackelyn PolingJason A Fritzie Prioleau, NP 06/14/20  12:18 AM

## 2020-06-14 NOTE — ED Notes (Signed)
Patient appears to be sleeping.  RR even and unlabored. 

## 2020-06-14 NOTE — Discharge Instructions (Signed)

## 2021-01-25 IMAGING — CT CT ABD-PELV W/ CM
2 of 4 series · 15 of 46 positions shown, 17 images · IV contrast (OMNIPAQUE)
Comparison: None.

CLINICAL DATA: 19-year-old female with abdominal pain and nausea
vomiting since yesterday.

EXAM:
CT ABDOMEN AND PELVIS WITH CONTRAST
TECHNIQUE: Multidetector CT imaging of the abdomen and pelvis was performed
using the standard protocol following bolus administration of
intravenous contrast.
CONTRAST:  100mL OMNIPAQUE IOHEXOL 300 MG/ML  SOLN

[Series 2: axial st · axial · 0.68mm/px · z∈[-597,-247]mm · 12 of 80 slices shown, 14 images]
[im 5/80  soft-tissue]
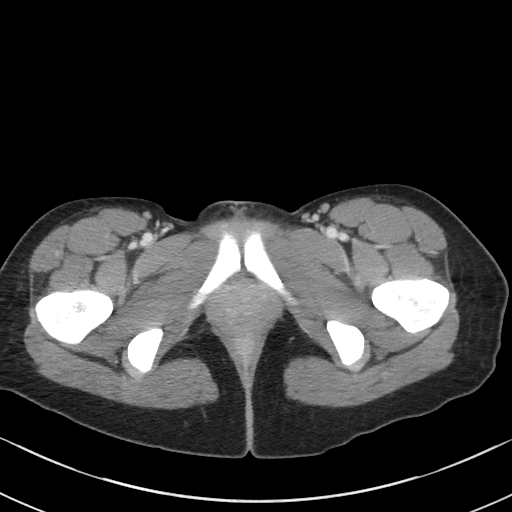
[im 5/80  bone]
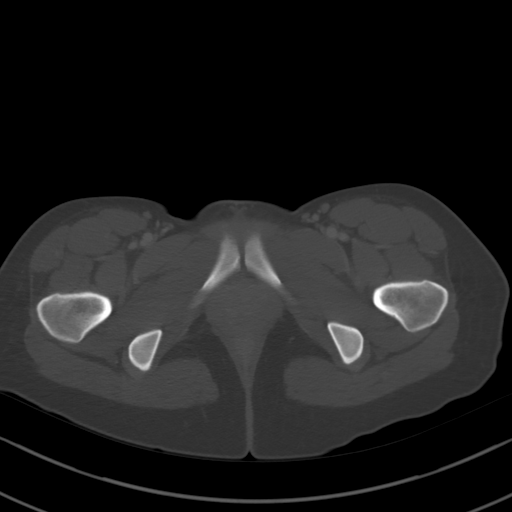
[im 13/80  soft-tissue]
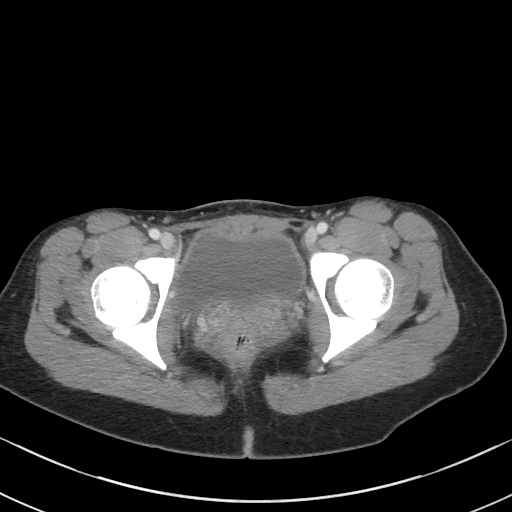
[im 17/80  soft-tissue]
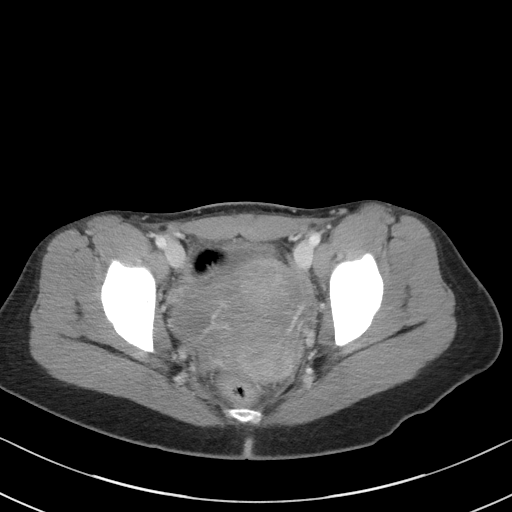
[im 25/80  soft-tissue]
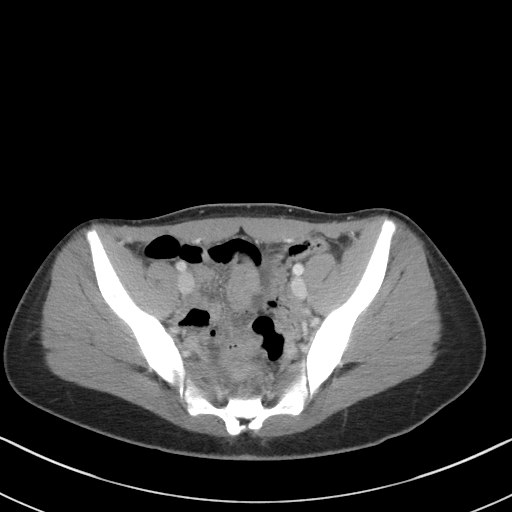
[im 30/80  soft-tissue]
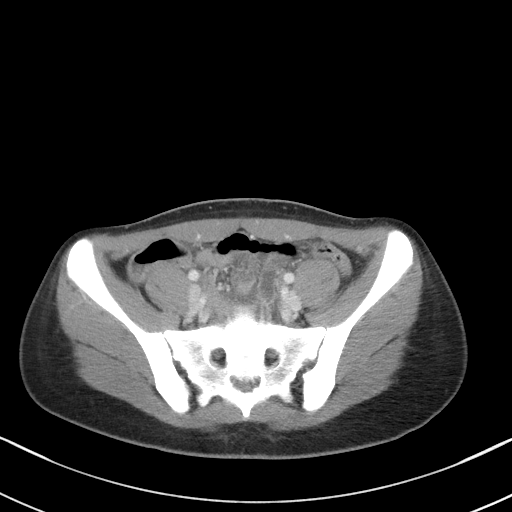
[im 38/80  soft-tissue]
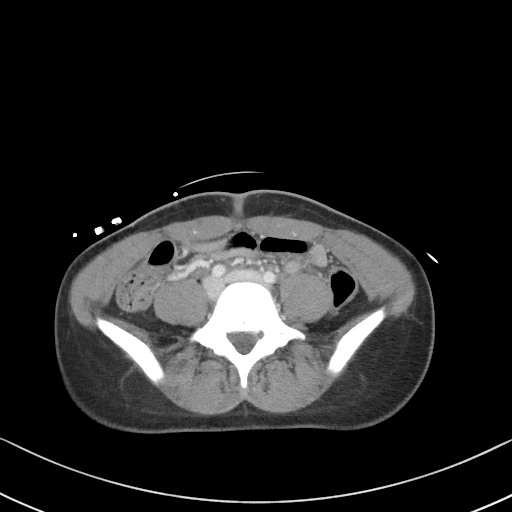
[im 42/80  soft-tissue]
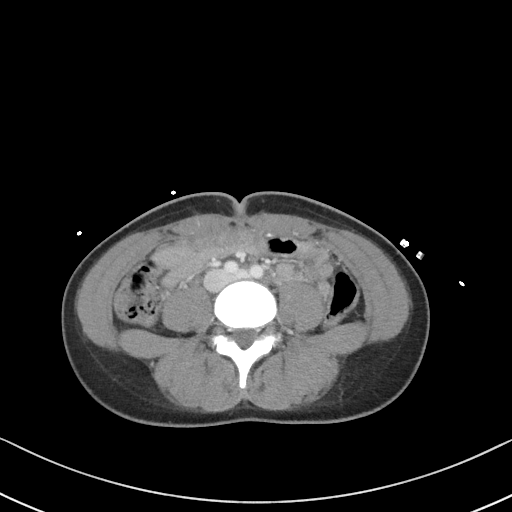
[im 50/80  soft-tissue]
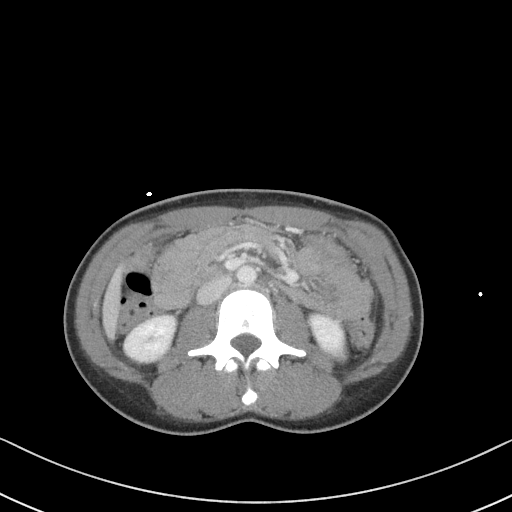
[im 55/80  soft-tissue]
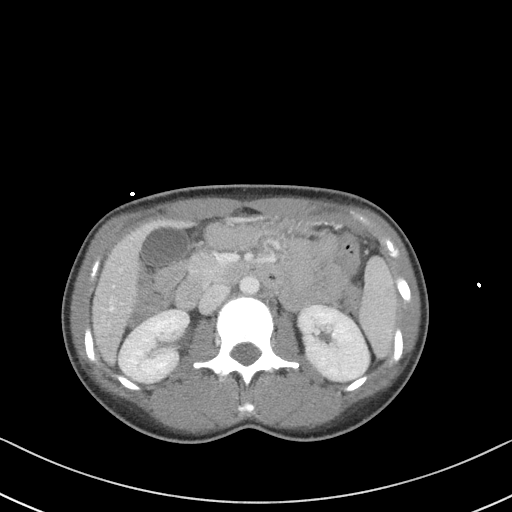
[im 55/80  bone]
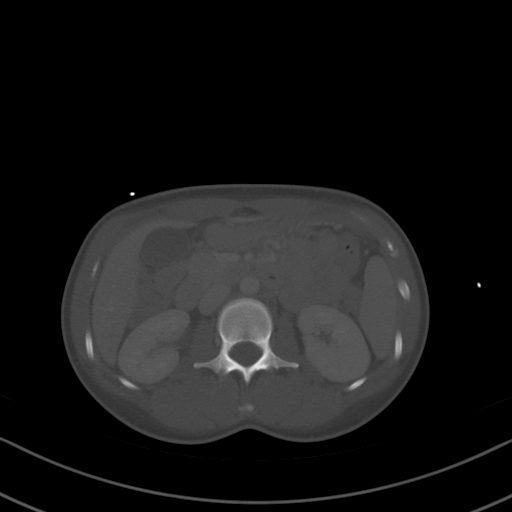
[im 63/80  soft-tissue]
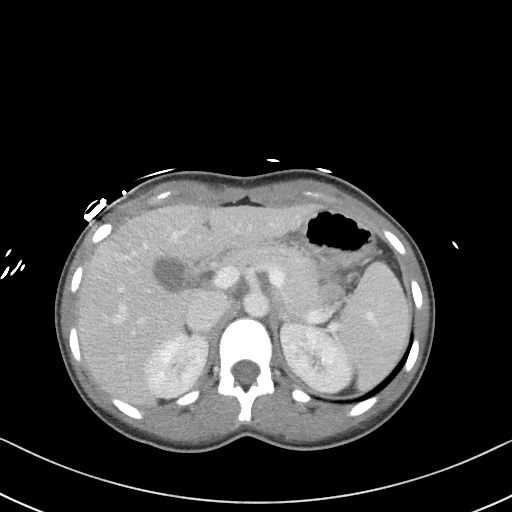
[im 67/80  soft-tissue]
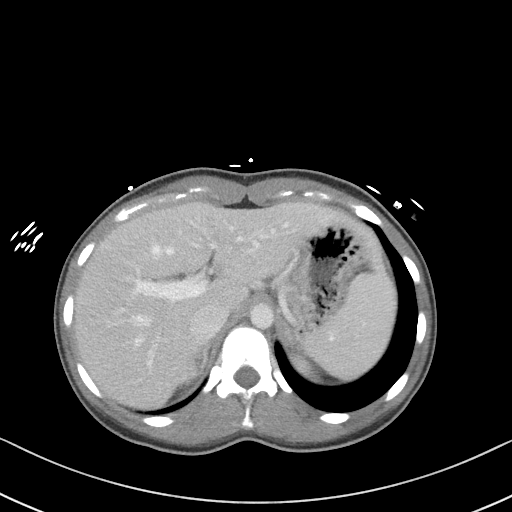
[im 75/80  soft-tissue]
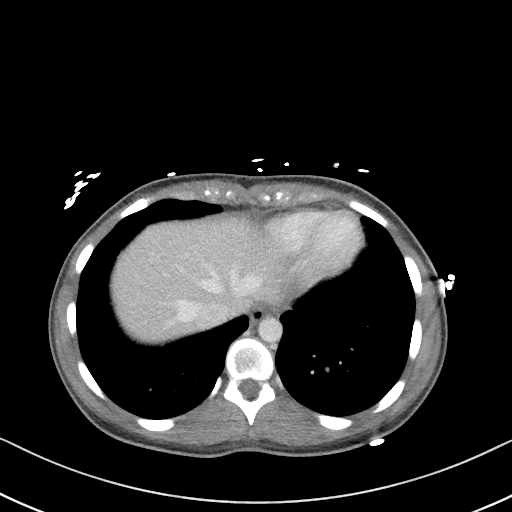

[Series 5: coronal st · coronal · 0.56mm/px · 3 of 65 slices shown]
[im 22/65  soft-tissue]
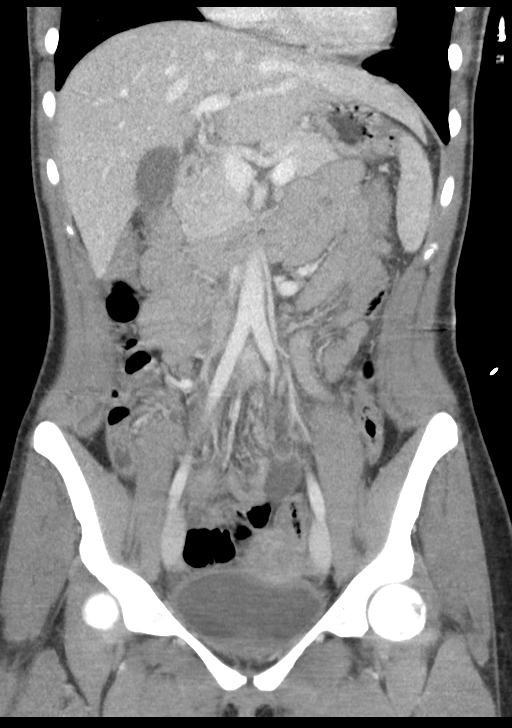
[im 29/65  soft-tissue]
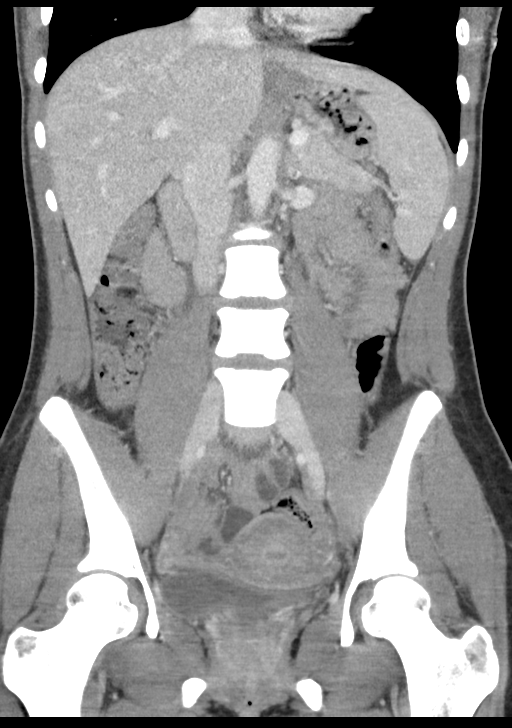
[im 36/65  soft-tissue]
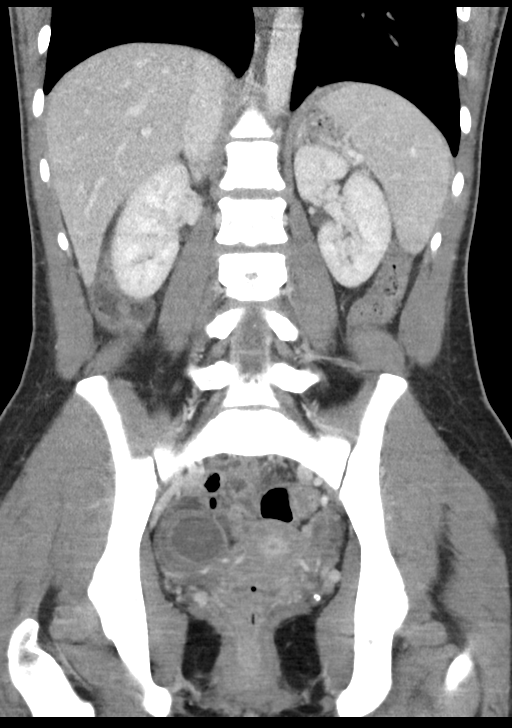

[15 of 46 positions shown; findings below may reference images not displayed]

FINDINGS: Lower chest: Negative.

Hepatobiliary: Negative liver and gallbladder.

Pancreas: Negative.

Spleen: Negative.

Adrenals/Urinary Tract: Normal adrenal glands. Symmetric and normal
renal enhancement. No hydronephrosis. No nephrolithiasis identified.
Proximal ureters appear decompressed. Unremarkable urinary bladder.

Stomach/Bowel: No oral contrast administered.

Decompressed rectum and sigmoid colon. The left and transverse colon
segments are also decompressed and the transverse colon appears
indistinct as seen on series 2, image 36 and coronal image 16. The
right colon has a more normal appearance. The terminal ileum is on
coronal image 20. The appendix is not identified. No pericecal
inflammation is identified.

There are multiple gas and fluid containing small bowel loops in the
pelvis which are nondilated. Decompressed small bowel elsewhere. The
stomach is largely decompressed. Negative duodenum. No free air. No
free fluid identified in the abdomen.

Vascular/Lymphatic: Major arterial structures in the abdomen and
pelvis are patent. No atherosclerosis identified. Portal venous
system appears patent. The central venous structures in the abdomen
and pelvis also appear patent.

Reproductive: Probable physiologic 29 mm right ovarian cyst, with
simple fluid density. Otherwise negative.

Other: Small volume pelvic free fluid. Simple fluid density.

Musculoskeletal: Negative.
IMPRESSION: 1. Small volume of free fluid in the pelvis is nonspecific, but
favored to be physiologic along with a right ovarian cyst.

2. The appendix cannot be identified. And most of the colon and
small bowel is decompressed. But is difficult to exclude mild acute
inflammation of the transverse colon (colitis) or involving small
bowel loops in the pelvis (enteritis).
If acute appendicitis is suspected or the clinical course is
equivocal then a repeat CT with both oral and IV contrast is
recommended in 12-24 hours.

## 2021-01-26 IMAGING — US US PELVIS COMPLETE TRANSABD/TRANSVAG W DUPLEX
2 series · 13 of 25 positions shown · non-contrast
Comparison: CT yesterday

CLINICAL DATA: Abdominal pain for 2 days.

EXAM:
TRANSABDOMINAL AND TRANSVAGINAL ULTRASOUND OF PELVIS
DOPPLER ULTRASOUND OF OVARIES
TECHNIQUE: Both transabdominal and transvaginal ultrasound examinations of the
pelvis were performed. Transabdominal technique was performed for
global imaging of the pelvis including uterus, ovaries, adnexal
regions, and pelvic cul-de-sac.
It was necessary to proceed with endovaginal exam following the
transabdominal exam to visualize the right ovary. Color and duplex
Doppler ultrasound was utilized to evaluate blood flow to the
ovaries.

[Series 1: us pelvis complete transabd/transvag w duplex · 12 of 83 slices shown (1 of 2)]
[im 1/83]
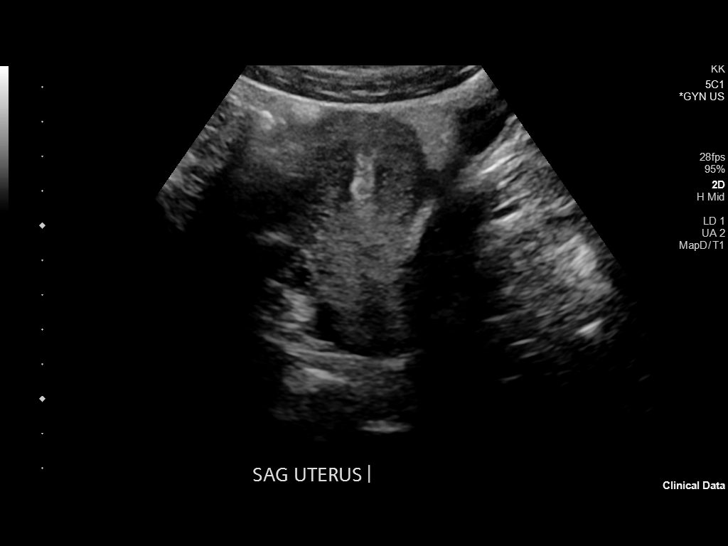
[im 8/83]
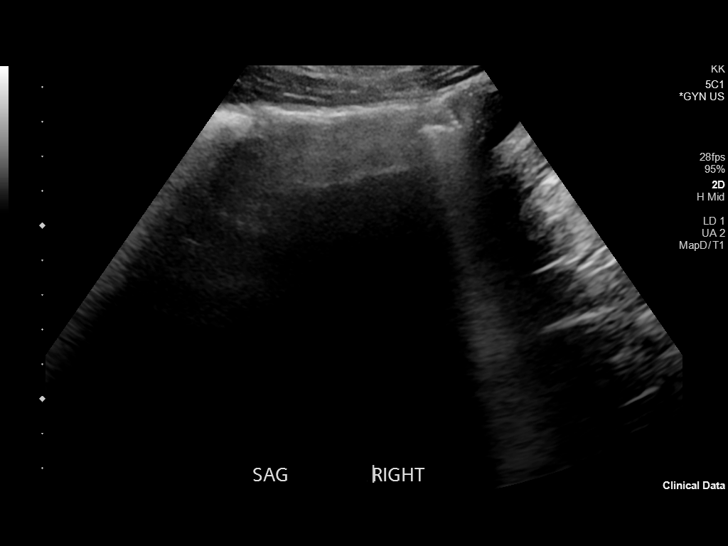
[im 15/83]
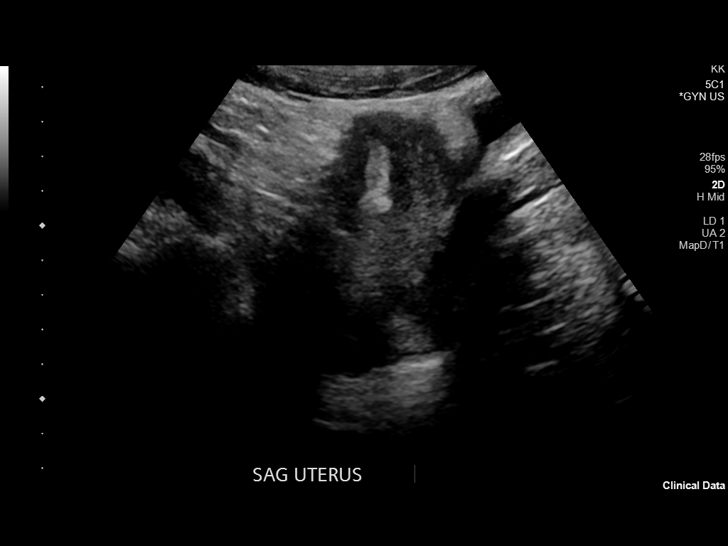
[im 22/83]
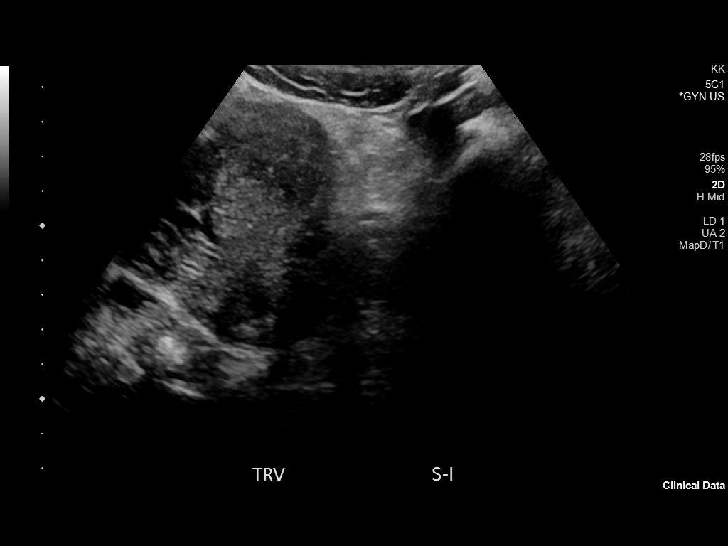
[im 29/83]
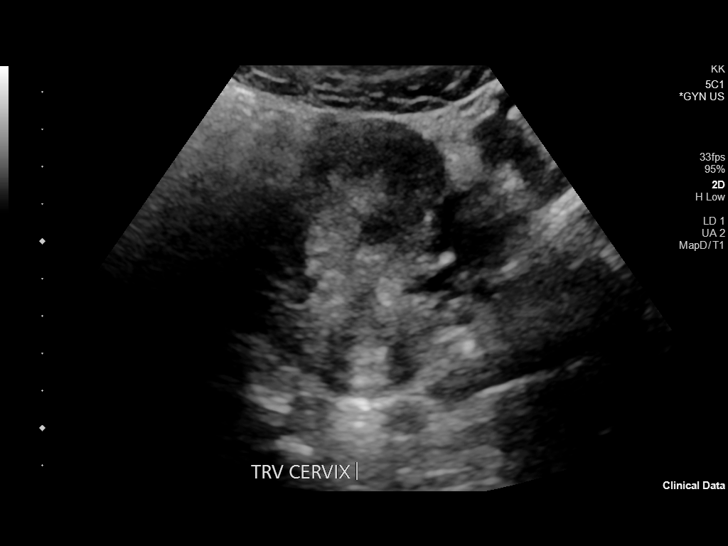
[im 36/83]
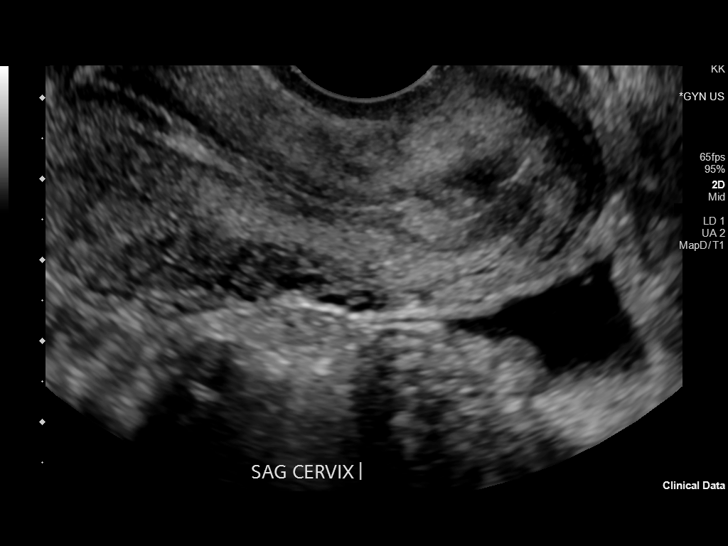
[im 43/83]
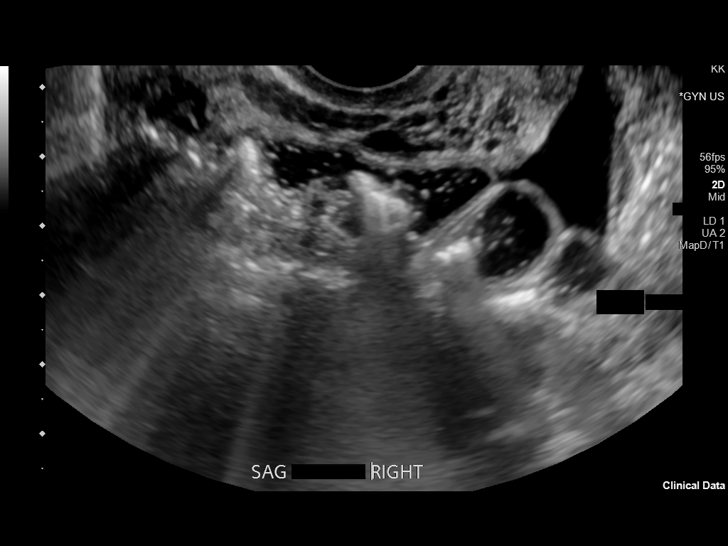
[im 50/83]
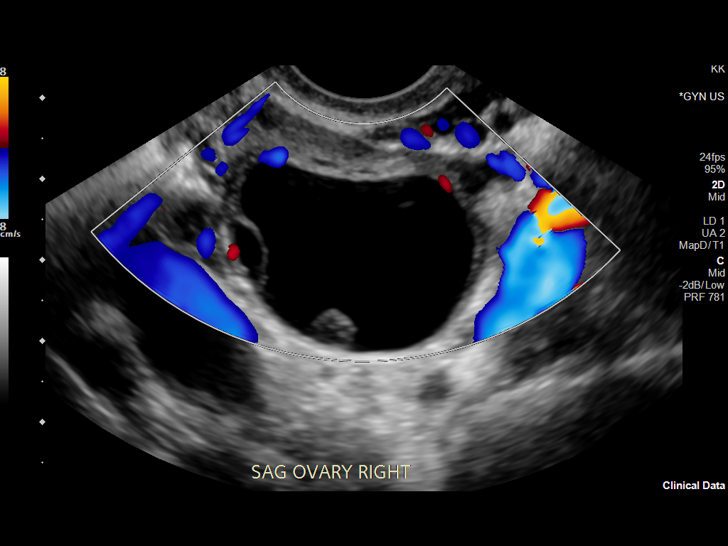
[im 58/83]
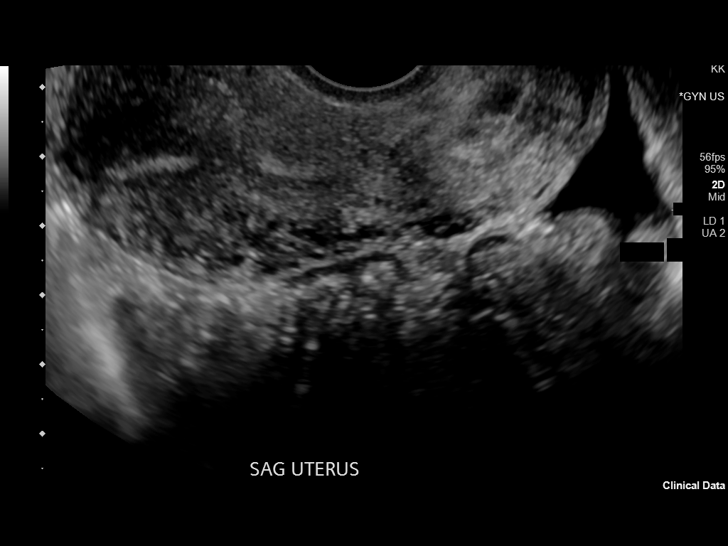
[im 65/83]
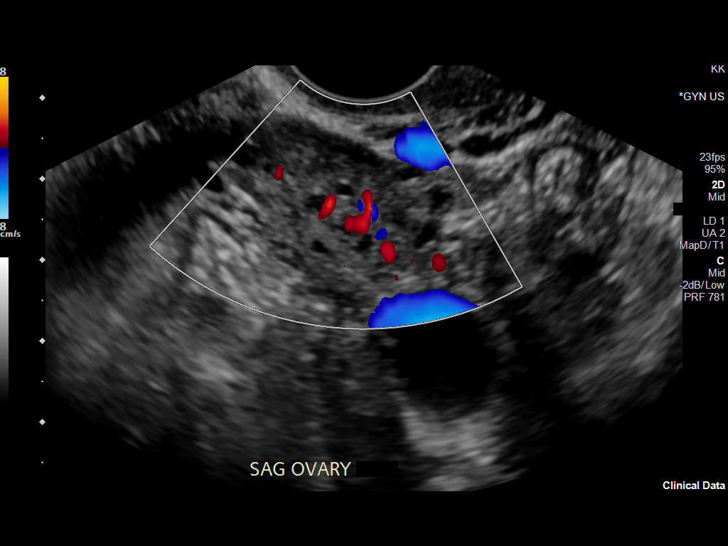
[im 72/83]
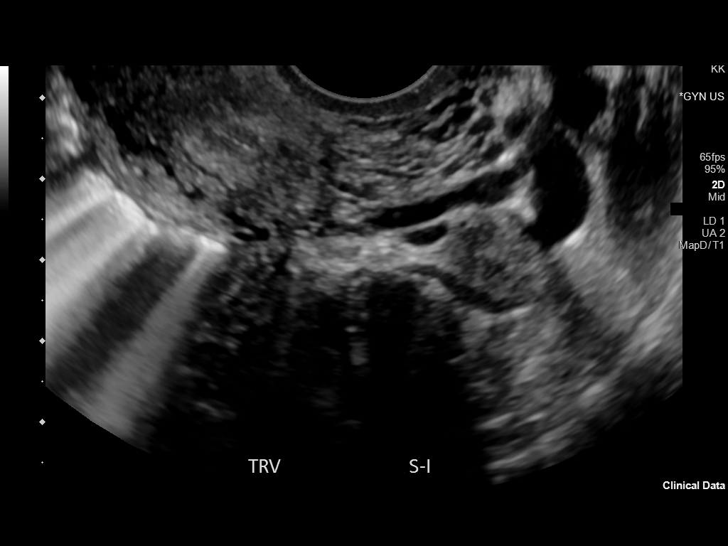
[im 79/83]
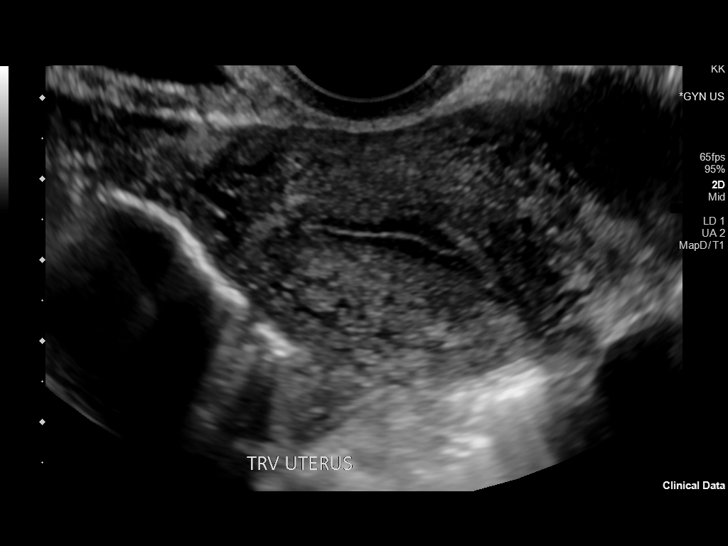

[Series 2: us pelvis complete transabd/transvag w duplex · 1 of 1 slices shown (2 of 2)]
[im 1/1]
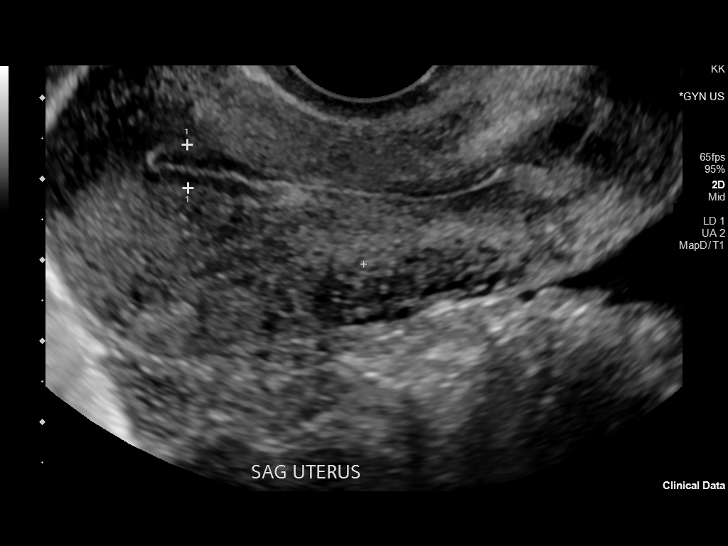

[13 of 25 positions shown; findings below may reference images not displayed]

FINDINGS: Uterus

Measurements: 7.8 x 4.1 x 5.1 cm = volume: 85 mL. Myometrial
echotexture is slightly heterogeneous. No fibroids or other mass
visualized.

Endometrium

Thickness: 5 mm, normal.  No focal abnormality visualized.

Right ovary

Measurements: 3.8 x 3.8 x 2.6 cm = volume: 20 mL. Right ovarian cyst
measures 2.8 x 2.0 x 2.9 cm with internal septations/cyst within
cyst. No enhancing or solid nodule. Blood flow is noted. No extra
ovarian adnexal mass.

Left ovary

Measurements: 2.9 x 2.1 x 2.1 cm = volume: 6.6 mL. Normal appearance
with physiologic follicles. No adnexal mass.

Pulsed Doppler evaluation of both ovaries demonstrates normal
low-resistance arterial and venous waveforms.

Other findings

Small volume simple free fluid in the pelvis.
IMPRESSION: 1. Blood flow to both ovaries without torsion.
2. Complex cyst in the right ovary measuring 2.9 cm with internal
septation/cyst within a cyst. No evidence of solid component.
Short-interval follow up ultrasound in 6-12 weeks is recommended,
preferably during the week following the patient's normal menses.
3. Normal sonographic appearance of the left ovary.
4. Small amount of simple free fluid in the pelvis. This may be
physiologic.

## 2021-06-01 ENCOUNTER — Ambulatory Visit (INDEPENDENT_AMBULATORY_CARE_PROVIDER_SITE_OTHER): Payer: 59

## 2021-06-01 ENCOUNTER — Encounter (HOSPITAL_COMMUNITY): Payer: Self-pay | Admitting: *Deleted

## 2021-06-01 ENCOUNTER — Other Ambulatory Visit: Payer: Self-pay

## 2021-06-01 ENCOUNTER — Ambulatory Visit (HOSPITAL_COMMUNITY)
Admission: EM | Admit: 2021-06-01 | Discharge: 2021-06-01 | Disposition: A | Discharge status: Home / Self Care | Payer: 59 | Attending: Physician Assistant | Admitting: Physician Assistant

## 2021-06-01 DIAGNOSIS — M79622 Pain in left upper arm: Secondary | ICD-10-CM | POA: Diagnosis not present

## 2021-06-01 DIAGNOSIS — M545 Low back pain, unspecified: Secondary | ICD-10-CM | POA: Diagnosis not present

## 2021-06-01 DIAGNOSIS — M79602 Pain in left arm: Secondary | ICD-10-CM

## 2021-06-01 LAB — POC URINE PREG, ED: Preg Test, Ur: NEGATIVE

## 2021-06-01 MED ORDER — NAPROXEN 375 MG PO TABS: 375.0000 mg | ORAL_TABLET | Freq: Two times a day (BID) | ORAL | 0 refills | Status: DC

## 2021-06-01 MED ORDER — TIZANIDINE HCL 4 MG PO TABS: 4.0000 mg | ORAL_TABLET | Freq: Three times a day (TID) | ORAL | 0 refills | Status: DC | PRN
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
## 2021-06-01 NOTE — Discharge Instructions (Addendum)
Your x-ray was normal.  Start Naprosyn twice daily.  Do not take NSAIDs including aspirin, ibuprofen/Advil, naproxen/Aleve with this medication as it can cause stomach bleeding.  Can use Tylenol for breakthrough pain.  Take Zanaflex up to 3 times a day as needed.  This can make you sleepy so do not drive or drink alcohol taking it.  Use heat, stretch, rest for additional symptom relief.  If your symptoms are improving follow-up with sports medicine as they can potentially set you up with physical therapy or other things that may help the healing process.  If you have any worsening symptoms please return for reevaluation. ?
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
## 2022-04-10 ENCOUNTER — Emergency Department (HOSPITAL_COMMUNITY): Payer: 59

## 2022-04-10 ENCOUNTER — Other Ambulatory Visit: Payer: Self-pay

## 2022-04-10 ENCOUNTER — Inpatient Hospital Stay (HOSPITAL_COMMUNITY)
Admission: EM | Admit: 2022-04-10 | Discharge: 2022-04-13 | DRG: 432 | Disposition: A | Discharge status: Other Acute Inpatient Hospital | Payer: 59 | Attending: Infectious Diseases | Admitting: Infectious Diseases

## 2022-04-10 DIAGNOSIS — K709 Alcoholic liver disease, unspecified: Principal | ICD-10-CM | POA: Diagnosis present

## 2022-04-10 DIAGNOSIS — Z7289 Other problems related to lifestyle: Secondary | ICD-10-CM

## 2022-04-10 DIAGNOSIS — F17290 Nicotine dependence, other tobacco product, uncomplicated: Secondary | ICD-10-CM | POA: Diagnosis present

## 2022-04-10 DIAGNOSIS — E860 Dehydration: Secondary | ICD-10-CM | POA: Diagnosis present

## 2022-04-10 DIAGNOSIS — E876 Hypokalemia: Secondary | ICD-10-CM | POA: Diagnosis present

## 2022-04-10 DIAGNOSIS — Z818 Family history of other mental and behavioral disorders: Secondary | ICD-10-CM

## 2022-04-10 DIAGNOSIS — Z62810 Personal history of physical and sexual abuse in childhood: Secondary | ICD-10-CM

## 2022-04-10 DIAGNOSIS — K5900 Constipation, unspecified: Secondary | ICD-10-CM | POA: Diagnosis present

## 2022-04-10 DIAGNOSIS — K2990 Gastroduodenitis, unspecified, without bleeding: Secondary | ICD-10-CM | POA: Diagnosis present

## 2022-04-10 DIAGNOSIS — R112 Nausea with vomiting, unspecified: Secondary | ICD-10-CM

## 2022-04-10 DIAGNOSIS — D649 Anemia, unspecified: Secondary | ICD-10-CM | POA: Diagnosis present

## 2022-04-10 DIAGNOSIS — K5731 Diverticulosis of large intestine without perforation or abscess with bleeding: Secondary | ICD-10-CM | POA: Diagnosis present

## 2022-04-10 DIAGNOSIS — G4700 Insomnia, unspecified: Secondary | ICD-10-CM | POA: Diagnosis present

## 2022-04-10 DIAGNOSIS — Z789 Other specified health status: Secondary | ICD-10-CM

## 2022-04-10 DIAGNOSIS — R441 Visual hallucinations: Secondary | ICD-10-CM | POA: Diagnosis present

## 2022-04-10 DIAGNOSIS — M545 Low back pain, unspecified: Secondary | ICD-10-CM | POA: Diagnosis present

## 2022-04-10 DIAGNOSIS — E8729 Other acidosis: Secondary | ICD-10-CM | POA: Diagnosis present

## 2022-04-10 DIAGNOSIS — F3164 Bipolar disorder, current episode mixed, severe, with psychotic features: Secondary | ICD-10-CM | POA: Diagnosis present

## 2022-04-10 DIAGNOSIS — K7581 Nonalcoholic steatohepatitis (NASH): Secondary | ICD-10-CM | POA: Diagnosis present

## 2022-04-10 DIAGNOSIS — F419 Anxiety disorder, unspecified: Secondary | ICD-10-CM | POA: Diagnosis present

## 2022-04-10 DIAGNOSIS — R45851 Suicidal ideations: Secondary | ICD-10-CM | POA: Diagnosis present

## 2022-04-10 DIAGNOSIS — K279 Peptic ulcer, site unspecified, unspecified as acute or chronic, without hemorrhage or perforation: Secondary | ICD-10-CM | POA: Diagnosis present

## 2022-04-10 DIAGNOSIS — Z62811 Personal history of psychological abuse in childhood: Secondary | ICD-10-CM

## 2022-04-10 DIAGNOSIS — E8809 Other disorders of plasma-protein metabolism, not elsewhere classified: Secondary | ICD-10-CM | POA: Diagnosis present

## 2022-04-10 DIAGNOSIS — R440 Auditory hallucinations: Secondary | ICD-10-CM | POA: Diagnosis present

## 2022-04-10 DIAGNOSIS — Z1152 Encounter for screening for COVID-19: Secondary | ICD-10-CM

## 2022-04-10 DIAGNOSIS — Y903 Blood alcohol level of 60-79 mg/100 ml: Secondary | ICD-10-CM | POA: Diagnosis present

## 2022-04-10 DIAGNOSIS — N898 Other specified noninflammatory disorders of vagina: Secondary | ICD-10-CM | POA: Diagnosis present

## 2022-04-10 DIAGNOSIS — F1020 Alcohol dependence, uncomplicated: Secondary | ICD-10-CM | POA: Diagnosis present

## 2022-04-10 DIAGNOSIS — R443 Hallucinations, unspecified: Secondary | ICD-10-CM | POA: Diagnosis present

## 2022-04-10 DIAGNOSIS — F1290 Cannabis use, unspecified, uncomplicated: Secondary | ICD-10-CM | POA: Diagnosis present

## 2022-04-10 DIAGNOSIS — F1090 Alcohol use, unspecified, uncomplicated: Secondary | ICD-10-CM | POA: Diagnosis present

## 2022-04-10 LAB — COMPREHENSIVE METABOLIC PANEL
ALT: 69 U/L — ABNORMAL HIGH (ref 0–44)
AST: 139 U/L — ABNORMAL HIGH (ref 15–41)
Albumin: 4.4 g/dL (ref 3.5–5.0)
Alkaline Phosphatase: 72 U/L (ref 38–126)
Anion gap: 26 — ABNORMAL HIGH (ref 5–15)
BUN: 8 mg/dL (ref 6–20)
CO2: 9 mmol/L — ABNORMAL LOW (ref 22–32)
Calcium: 9.7 mg/dL (ref 8.9–10.3)
Chloride: 101 mmol/L (ref 98–111)
Creatinine, Ser: 1 mg/dL (ref 0.44–1.00)
GFR, Estimated: >60 mL/min (ref >60)
Glucose, Bld: 57 mg/dL — ABNORMAL LOW (ref 70–99)
Potassium: 4.3 mmol/L (ref 3.5–5.1)
Sodium: 136 mmol/L (ref 135–145)
Total Bilirubin: 0.6 mg/dL (ref 0.3–1.2)
Total Protein: 8.1 g/dL (ref 6.5–8.1)

## 2022-04-10 LAB — CBC WITH DIFFERENTIAL/PLATELET
Abs Immature Granulocytes: 0.1 10*3/uL — ABNORMAL HIGH (ref 0.00–0.07)
Basophils Absolute: 0 10*3/uL (ref 0.0–0.1)
Basophils Relative: 0 %
Eosinophils Absolute: 0 10*3/uL (ref 0.0–0.5)
Eosinophils Relative: 0 %
HCT: 43.3 % (ref 36.0–46.0)
Hemoglobin: 13.6 g/dL (ref 12.0–15.0)
Immature Granulocytes: 1 %
Lymphocytes Relative: 6 %
Lymphs Abs: 0.9 10*3/uL (ref 0.7–4.0)
MCH: 25.5 pg — ABNORMAL LOW (ref 26.0–34.0)
MCHC: 31.4 g/dL (ref 30.0–36.0)
MCV: 81.2 fL (ref 80.0–100.0)
Monocytes Absolute: 0.5 10*3/uL (ref 0.1–1.0)
Monocytes Relative: 3 %
Neutro Abs: 14 10*3/uL — ABNORMAL HIGH (ref 1.7–7.7)
Neutrophils Relative %: 90 %
Platelets: 435 10*3/uL — ABNORMAL HIGH (ref 150–400)
RBC: 5.33 MIL/uL — ABNORMAL HIGH (ref 3.87–5.11)
RDW: 15.4 % (ref 11.5–15.5)
WBC: 15.6 10*3/uL — ABNORMAL HIGH (ref 4.0–10.5)
nRBC: 0 % (ref 0.0–0.2)

## 2022-04-10 LAB — ETHANOL: Alcohol, Ethyl (B): 69 mg/dL — ABNORMAL HIGH (ref <10)

## 2022-04-10 LAB — I-STAT BETA HCG BLOOD, ED (MC, WL, AP ONLY): I-stat hCG, quantitative: <5 m[IU]/mL (ref <5)

## 2022-04-10 LAB — TROPONIN I (HIGH SENSITIVITY)
Troponin I (High Sensitivity): 4 ng/L (ref <18)
Troponin I (High Sensitivity): 6 ng/L (ref <18)

## 2022-04-10 LAB — MAGNESIUM: Magnesium: 1.9 mg/dL (ref 1.7–2.4)

## 2022-04-10 MED ORDER — ONDANSETRON 4 MG PO TBDP
4.0000 mg | ORAL_TABLET | Freq: Once | ORAL | Status: AC
  Administered 2022-04-10: 4 mg via ORAL
  Filled 2022-04-10: qty 1

## 2022-04-10 NOTE — ED Provider Triage Note (Signed)
Emergency Medicine Provider Triage Evaluation Note  Darlene King , a 22 y.o. female  was evaluated in triage.  Pt complains of concerns for alcohol intoxication.  Notes that she consumed 2 pints of rum yesterday.  She has been drinking a pint of rum a day over the past 5-6 months.  Denies history of DTs or withdrawal seizures.  Has associated chest pain and shortness of breath..  Also has nausea and vomiting.  Patient actively having emesis in the room.  Review of Systems  Positive:  Negative:   Physical Exam  BP 131/78   Pulse (!) 117   Temp 98.4 F (36.9 C) (Oral)   Resp 18   Ht 5\' 4"  (1.626 m)   Wt 49.9 kg   SpO2 100%   BMI 18.88 kg/m  Gen:   Awake, no distress   Resp:  Normal effort  MSK:   Moves extremities without difficulty  Other:    Medical Decision Making  Medically screening exam initiated at 8:35 PM.  Appropriate orders placed.  Adithi Gammon was informed that the remainder of the evaluation will be completed by another provider, this initial triage assessment does not replace that evaluation, and the importance of remaining in the ED until their evaluation is complete.  Work-up initiated.    Lakendra Helling A, PA-C 04/10/22 2036

## 2022-04-10 NOTE — ED Triage Notes (Signed)
Pt states I think I have alcohol poisoning I drank almost 2 pints of rum last night and have been throwing up all day. Complains of headache. Pt vomiting in triage. Pt states she drinks daily about half a pint a day.

## 2022-04-11 ENCOUNTER — Encounter (HOSPITAL_COMMUNITY): Payer: Self-pay | Admitting: Infectious Diseases

## 2022-04-11 ENCOUNTER — Emergency Department (HOSPITAL_COMMUNITY): Payer: 59

## 2022-04-11 DIAGNOSIS — E8729 Other acidosis: Secondary | ICD-10-CM | POA: Diagnosis not present

## 2022-04-11 LAB — BLOOD GAS, VENOUS
Acid-base deficit: 20.4 mmol/L — ABNORMAL HIGH (ref 0.0–2.0)
Bicarbonate: 7.8 mmol/L — ABNORMAL LOW (ref 20.0–28.0)
O2 Saturation: 95.3 %
Patient temperature: 36.7
pCO2, Ven: 25 mmHg — ABNORMAL LOW (ref 44–60)
pH, Ven: 7.1 — CL (ref 7.25–7.43)
pO2, Ven: 72 mmHg — ABNORMAL HIGH (ref 32–45)

## 2022-04-11 LAB — BASIC METABOLIC PANEL
Anion gap: 18 — ABNORMAL HIGH (ref 5–15)
Anion gap: 15 (ref 5–15)
Anion gap: 13 (ref 5–15)
Anion gap: 13 (ref 5–15)
BUN: 7 mg/dL (ref 6–20)
BUN: 5 mg/dL — ABNORMAL LOW (ref 6–20)
BUN: 6 mg/dL (ref 6–20)
BUN: 6 mg/dL (ref 6–20)
CO2: 11 mmol/L — ABNORMAL LOW (ref 22–32)
CO2: 17 mmol/L — ABNORMAL LOW (ref 22–32)
CO2: 18 mmol/L — ABNORMAL LOW (ref 22–32)
CO2: 19 mmol/L — ABNORMAL LOW (ref 22–32)
Calcium: 9.4 mg/dL (ref 8.9–10.3)
Calcium: 9.9 mg/dL (ref 8.9–10.3)
Calcium: 9.9 mg/dL (ref 8.9–10.3)
Calcium: 9.8 mg/dL (ref 8.9–10.3)
Chloride: 104 mmol/L (ref 98–111)
Chloride: 100 mmol/L (ref 98–111)
Chloride: 100 mmol/L (ref 98–111)
Chloride: 99 mmol/L (ref 98–111)
Creatinine, Ser: 0.84 mg/dL (ref 0.44–1.00)
Creatinine, Ser: 0.78 mg/dL (ref 0.44–1.00)
Creatinine, Ser: 0.88 mg/dL (ref 0.44–1.00)
Creatinine, Ser: 0.79 mg/dL (ref 0.44–1.00)
GFR, Estimated: >60 mL/min (ref >60)
GFR, Estimated: >60 mL/min (ref >60)
GFR, Estimated: >60 mL/min (ref >60)
GFR, Estimated: >60 mL/min (ref >60)
Glucose, Bld: 91 mg/dL (ref 70–99)
Glucose, Bld: 79 mg/dL (ref 70–99)
Glucose, Bld: 112 mg/dL — ABNORMAL HIGH (ref 70–99)
Glucose, Bld: 70 mg/dL (ref 70–99)
Potassium: 5.2 mmol/L — ABNORMAL HIGH (ref 3.5–5.1)
Potassium: 4.2 mmol/L (ref 3.5–5.1)
Potassium: 3.7 mmol/L (ref 3.5–5.1)
Potassium: 3.8 mmol/L (ref 3.5–5.1)
Sodium: 133 mmol/L — ABNORMAL LOW (ref 135–145)
Sodium: 132 mmol/L — ABNORMAL LOW (ref 135–145)
Sodium: 131 mmol/L — ABNORMAL LOW (ref 135–145)
Sodium: 131 mmol/L — ABNORMAL LOW (ref 135–145)

## 2022-04-11 LAB — SALICYLATE LEVEL: Salicylate Lvl: <7 mg/dL — ABNORMAL LOW (ref 7.0–30.0)

## 2022-04-11 LAB — CBG MONITORING, ED: Glucose-Capillary: 97 mg/dL (ref 70–99)

## 2022-04-11 LAB — LACTIC ACID, PLASMA
Lactic Acid, Venous: 5.8 mmol/L (ref 0.5–1.9)
Lactic Acid, Venous: 2.8 mmol/L (ref 0.5–1.9)

## 2022-04-11 LAB — LIPASE, BLOOD: Lipase: 27 U/L (ref 11–51)

## 2022-04-11 MED ORDER — IOHEXOL 350 MG/ML SOLN
65.0000 mL | Freq: Once | INTRAVENOUS | Status: AC | PRN
  Administered 2022-04-11: 65 mL via INTRAVENOUS

## 2022-04-11 MED ORDER — HYDROMORPHONE HCL 2 MG PO TABS
1.0000 mg | ORAL_TABLET | Freq: Four times a day (QID) | ORAL | Status: DC | PRN
  Administered 2022-04-11 – 2022-04-13 (×5): 1 mg via ORAL
  Filled 2022-04-11 (×5): qty 1

## 2022-04-11 MED ORDER — FOLIC ACID 1 MG PO TABS
1.0000 mg | ORAL_TABLET | Freq: Every day | ORAL | Status: DC
  Administered 2022-04-11 – 2022-04-13 (×3): 1 mg via ORAL
  Filled 2022-04-11 (×3): qty 1

## 2022-04-11 MED ORDER — DOXYCYCLINE HYCLATE 100 MG PO TABS
100.0000 mg | ORAL_TABLET | Freq: Two times a day (BID) | ORAL | Status: DC
  Administered 2022-04-11 – 2022-04-13 (×4): 100 mg via ORAL
  Filled 2022-04-11 (×4): qty 1

## 2022-04-11 MED ORDER — THIAMINE HCL 100 MG/ML IJ SOLN
100.0000 mg | Freq: Every day | INTRAMUSCULAR | Status: DC
  Filled 2022-04-11 (×3): qty 2

## 2022-04-11 MED ORDER — LORAZEPAM 1 MG PO TABS: 0.0000 mg | ORAL_TABLET | Freq: Two times a day (BID) | ORAL | Status: DC

## 2022-04-11 MED ORDER — LACTATED RINGERS IV BOLUS
1000.0000 mL | Freq: Once | INTRAVENOUS | Status: AC
  Administered 2022-04-11: 1000 mL via INTRAVENOUS

## 2022-04-11 MED ORDER — DEXTROSE IN LACTATED RINGERS 5 % IV SOLN: INTRAVENOUS | Status: DC

## 2022-04-11 MED ORDER — HYDROMORPHONE HCL 1 MG/ML IJ SOLN
0.5000 mg | INTRAMUSCULAR | Status: DC | PRN
  Administered 2022-04-11: 0.5 mg via INTRAVENOUS
  Filled 2022-04-11: qty 0.5

## 2022-04-11 MED ORDER — METOCLOPRAMIDE HCL 5 MG/ML IJ SOLN
10.0000 mg | Freq: Once | INTRAMUSCULAR | Status: AC
  Administered 2022-04-11: 10 mg via INTRAVENOUS
  Filled 2022-04-11: qty 2

## 2022-04-11 MED ORDER — SODIUM CHLORIDE 0.9 % IV BOLUS
1000.0000 mL | Freq: Once | INTRAVENOUS | Status: AC
  Administered 2022-04-11: 1000 mL via INTRAVENOUS

## 2022-04-11 MED ORDER — RIVAROXABAN 10 MG PO TABS
10.0000 mg | ORAL_TABLET | Freq: Every day | ORAL | Status: DC
  Administered 2022-04-11 – 2022-04-13 (×3): 10 mg via ORAL
  Filled 2022-04-11 (×3): qty 1

## 2022-04-11 MED ORDER — THIAMINE MONONITRATE 100 MG PO TABS
100.0000 mg | ORAL_TABLET | Freq: Every day | ORAL | Status: DC
  Administered 2022-04-11 – 2022-04-13 (×3): 100 mg via ORAL
  Filled 2022-04-11 (×3): qty 1

## 2022-04-11 MED ORDER — NICOTINE 14 MG/24HR TD PT24
14.0000 mg | MEDICATED_PATCH | Freq: Every day | TRANSDERMAL | Status: DC
  Administered 2022-04-11 – 2022-04-13 (×3): 14 mg via TRANSDERMAL
  Filled 2022-04-11 (×3): qty 1

## 2022-04-11 MED ORDER — METRONIDAZOLE 500 MG PO TABS
500.0000 mg | ORAL_TABLET | Freq: Two times a day (BID) | ORAL | Status: DC
  Administered 2022-04-11 – 2022-04-13 (×4): 500 mg via ORAL
  Filled 2022-04-11 (×4): qty 1

## 2022-04-11 MED ORDER — NITROFURANTOIN MONOHYD MACRO 100 MG PO CAPS
100.0000 mg | ORAL_CAPSULE | Freq: Two times a day (BID) | ORAL | Status: DC
  Administered 2022-04-11 – 2022-04-13 (×4): 100 mg via ORAL
  Filled 2022-04-11 (×5): qty 1

## 2022-04-11 MED ORDER — LORAZEPAM 1 MG PO TABS
1.0000 mg | ORAL_TABLET | Freq: Four times a day (QID) | ORAL | Status: DC
  Administered 2022-04-11 – 2022-04-12 (×3): 1 mg via ORAL
  Filled 2022-04-11 (×3): qty 1

## 2022-04-11 MED ORDER — ONDANSETRON HCL 4 MG/2ML IJ SOLN
4.0000 mg | Freq: Four times a day (QID) | INTRAMUSCULAR | Status: DC | PRN
  Administered 2022-04-11 – 2022-04-12 (×4): 4 mg via INTRAVENOUS
  Filled 2022-04-11 (×4): qty 2

## 2022-04-11 MED ORDER — ACETAMINOPHEN 325 MG PO TABS: 650.0000 mg | ORAL_TABLET | Freq: Four times a day (QID) | ORAL | Status: DC | PRN

## 2022-04-11 MED ORDER — LORAZEPAM 1 MG PO TABS
0.0000 mg | ORAL_TABLET | Freq: Four times a day (QID) | ORAL | Status: DC
  Administered 2022-04-11: 2 mg via ORAL
  Filled 2022-04-11: qty 2

## 2022-04-11 MED ORDER — HYDROMORPHONE HCL 1 MG/ML IJ SOLN
1.0000 mg | Freq: Once | INTRAMUSCULAR | Status: AC
  Administered 2022-04-11: 1 mg via INTRAVENOUS
  Filled 2022-04-11: qty 1

## 2022-04-11 MED ORDER — ONDANSETRON HCL 4 MG/2ML IJ SOLN
4.0000 mg | Freq: Once | INTRAMUSCULAR | Status: AC
  Administered 2022-04-11: 4 mg via INTRAVENOUS
  Filled 2022-04-11: qty 2

## 2022-04-11 NOTE — H&P (Signed)
NAME:  Darlene King, MRN:  [id number], DOB:  February 03, 2001, LOS: 0 ADMISSION DATE:  04/10/2022, Primary: Patient, No Pcp Per  CHIEF COMPLAINT:  abdominal pain   Medical Service: Internal Medicine Teaching Service         Attending Physician: Dr. Ginnie Smart, MD    First Contact: Dr. Geraldo Pitter Pager: [phone]  Second Contact: Dr. August Saucer Pager: [phone]       After Hours (After 5p/  First Contact Pager: [phone]  weekends / holidays): Second Contact Pager: [phone]   HISTORY OF PRESENT ILLNESS   Darlene King is 22yo person with history of depression and alcohol use presenting to [facility] with abdominal pain after excessive alcohol use. She reports on Sunday she drank 1 pint of rum then drank 2 pints of rum on Monday. The last thing she ate was a chicken biscuit a few days ago. She states that she typically drinks 3-4 days weekly, typically drinking a bottle of liquor at a time. She mentions that this last week she was having a rough week and tried to quit alcohol, however this turned into drinking excessively Sunday and Monday. Since Monday, she reports consistent nausea, vomiting, and generalized abdominal pain. Notes that this sometimes happens after drinking alcohol. She also mentions when she has previously tried to quit drinking she will get tremulous, febrile, and irritable. She denies previous history of seizures, but does mention previous visual hallucinations. Also notes occasional palpitations. Denies fevers, chest pain, dyspnea, diarrhea, dysuria. She does not have a history of diabetes. does not take any medications regularly, and currently does not have PCP.   PCP: Patient, No Pcp Per  ED COURSE   Upon arrival to [facility], patient tachycardic to 110's but otherwise hemodynamically stable sating well on room air. Lab results revealed anion gap metabolic acidosis with elevated lactic acid to 5.8. CT imaging was obtained due to persistent abdominal pain, revealed steatohepatitis and  shortened distance between aorta and SMA. General surgery was consulted for further evaluation and IMTS consulted for admission.    PAST MEDICAL HISTORY   She,  has no past medical history on file.   HOME MEDICATIONS   Prior to Admission medications   Medication Sig Start Date End Date Taking? Authorizing Provider  medroxyPROGESTERone (DEPO-PROVERA) 150 MG/ML injection Inject 150 mg into the muscle every 3 (three) months. 01/30/22  Yes [provider]  doxycycline (VIBRA-TABS) 100 MG tablet Take 100 mg by mouth 2 (two) times daily. Patient not taking: Reported on 04/11/2022    [provider]  lamoTRIgine Starter Kit-Orange 42 x 25 MG & 7 x 100 MG KIT Take by mouth daily. Patient not taking: Reported on 04/11/2022 02/15/22   [provider]  metroNIDAZOLE (FLAGYL) 500 MG tablet Take 500 mg by mouth 2 (two) times daily. Patient not taking: Reported on 04/11/2022 04/06/22   [provider]  naltrexone (DEPADE) 50 MG tablet Take 50 mg by mouth daily. Patient not taking: Reported on 04/11/2022 02/15/22   [provider]  traZODone (DESYREL) 50 MG tablet Take 50-100 mg by mouth at bedtime as needed for sleep. Patient not taking: Reported on 04/11/2022 02/15/22   [provider]  VRAYLAR 1.5 MG capsule Take 1.5 mg by mouth daily. 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 She typically drinks 1 pint of liquor 3-4 days weekly. On the days she does not drink she reports she smokes weed. Also reports daily vaping.   FAMILY HISTORY   Her family history includes Prostate cancer in her paternal grandfather.   REVIEW OF SYSTEMS   ROS per history of present illness.  PHYSICAL EXAMINATION   Blood pressure (!) 140/83, pulse (!)  112, temperature 98 F (36.7 C), temperature source Oral, resp. rate 16, height 5\' 4"  (1.626 m), weight 49.9 kg, SpO2 100 %.    Filed Weights   04/10/22 2023  Weight: 49.9 kg   GENERAL: Young person laying in bed in no acute distress HENT: Normocephalic, atraumatic. Dry mucous membranes EYES: Vision grossly in tact. No scleral icterus or conjunctival injection. CV: Tachycardic, regular rhythm. No murmurs appreciated. Warm extremities. PULM: Normal work of breathing on room air. Clear to auscultation bilaterally.  GI: Abdomen soft, non-distended. Mild tenderness in bilateral lower quadrants without rebound tenderness or guarding. Normoactive bowel sounds. MSK: Normal bulk, tone. No peripheral edema appreciated. SKIN: Warm, dry. No rashes or lesions appreciated. NEURO: Awake, alert, conversing appropriately. Grossly non-focal. PSYCH: Normal mood, affect, speech.  SIGNIFICANT DIAGNOSTIC TESTS   ECG: Sinus tachycardia. Similar to previous.  I personally reviewed patient's ECG with my interpretation as above.  CXR: No airspace disease, no effusions. No active disease appreciated.  I personally reviewed patient's CXR with my interpretation as above.  LABS      Latest Ref Rng & Units 04/10/2022    8:46 PM 06/14/2020   12:45 AM 05/29/2019    4:18 AM  CBC  WBC 4.0 - 10.5 K/uL 15.6  6.8  5.8   Hemoglobin 12.0 - 15.0 g/dL 13.6  12.7  9.6   Hematocrit 36.0 - 46.0 % 43.3  40.9  32.0   Platelets 150 - 400 K/uL 435  361  241       Latest Ref Rng & Units 04/11/2022    5:00 AM 04/10/2022    8:46 PM 06/14/2020   12:45 AM  BMP  Glucose 70 - 99 mg/dL 91  57  76   BUN 6 - 20 mg/dL 7  8  9    Creatinine 0.44 - 1.00 mg/dL 0.84  1.00  0.75   Sodium 135 - 145 mmol/L 133  136  136   Potassium 3.5 - 5.1 mmol/L 5.2  4.3  4.1   Chloride 98 - 111 mmol/L 104  101  103   CO2 22 - 32 mmol/L 11  9  24    Calcium 8.9 - 10.3 mg/dL 9.4  9.7  9.9     CONSULTS   General surgery  ASSESSMENT   Darlene King is 22yo person with history of depression and alcohol use admitted 1/61 with alcoholic ketoacidosis.  PLAN   Principal Problem:   Alcoholic ketoacidosis  #Alcohol ketoacidosis #EtOH abuse Patient presents to [facility] after recent intake of 3 pints of rum in 24h period. She has not eaten in a few days. Denies other drug use or other ingestion. She does appear dry on exam with tachycardia and dry mucous membranes. Likely lactic acidosis 2/2 severe dehydration. She does not have risk factors for euglycemic diabetic ketoacidosis. She did receive 3L intravenous fluids while in ED. Plan to give thiamine then will give maintenance fluids with dextrose until gap improves. Patient does recognize that her current drinking is a problem and she needs to find better ways to cope with  various life events. Will place [facility] consult for further assistance. She also appears to have some previous signs of alcohol withdrawal with tremors and hot, clammy skin, although she is not showing those signs today. Will continue with CIWA w/ Ativan. She is at risk for possible refeeding syndrome, will check Mg and phos in AM. - Thiamine, folic acid - Plan to initiate dextrose w/ LR after thiamine - BMP q6h, can d/c fluids once gap closes - Follow-up repeat lactic acid - CIWA w/ Ativan - Dilaudid 0.5mg  q3h PRN - Zofran 4mg  q6h PRN - TOC consult for substance abuse - Follow-up mag, phos in AM  #Hepatosteatosis #Elevated liver enzymes LFT's elevated with AST 139/ALT 69 consistent with alcohol-induced liver injury. CT imaging revealed liver steatosis as well. No RUQ pain, low concern for gallbladder disease at this point. Lipase normal, no epigastric pain. Will follow-up LFT's in AM after fluids.   - Follow-up hepatic function panel in AM  #Major depression disorder Patient reports she is not currently taking any medication. I suspect she could benefit from outpatient follow-up and counseling. I have offered her follow-up  with [facility]. - Follow-up with [facility]  #Shortened SMA/aorta distance Incidental finding on CT. ED consulted general surgery, will plan to see patient. Unlikely current presentation is due to compression syndrome, especially given her history.  - Follow-up general surgery recommendations  BEST PRACTICE   DIET: Regular IVF: LR w/ D5 DVT PPX: Xarelto BOWEL: n/a CODE: FULL FAM COM: n/a  DISPO: Admit patient to Observation with expected length of stay less than 2 midnights.  Sanjuan Dame, MD Internal Medicine Resident PGY-3 Pager [phone] 04/11/22 9:23 AM

## 2022-04-11 NOTE — Progress Notes (Signed)
Patient arrieved via [facility] from ED. Patient denies pain or discomfort at this time. Skin CDI, lungs clear bilateral, ABD soft non distended, skin warm to touch cap refil les than 3 seconds. Patient states Darlene King is originally from [place] and is attending College  here in [place] [employer]. States classes became difficult and Darlene King started drinking. Request information on  [facility] services upon DC.

## 2022-04-11 NOTE — ED Notes (Signed)
Pt provided pillow. 

## 2022-04-11 NOTE — Progress Notes (Signed)
[place] Darlene King. [place] to obtain further information regarding patient's recent diagnoses and care. Her aunt, Darlene King, raised concerns that she was recently diagnosed with UTI, chlamydia, and bacterial vaginosis but has not started the antibiotics prescribed: doxycycline, metronidazole, Macrobid. She also states the patient was recently diagnosed with bipolar disorder and she worries that she was manic leading up to this hospitalization.   The health center staff did confirm that she was evaluated on 04/09/2022 and diagnosed with UTI, chlamydia, and bacterial vaginosis. She was prescribed doxycycline, metronidazole, and macrobid for treatment. I have started these medications after this confirmation.  Her aunt is requesting additional testing of HIV and syphilis testing. HIV testing is ordered as this is tested on all patients when admitted to the hospital. We will speak with Alvester Chou regarding syphilis testing.  The health center did additionally confirm that she was diagnosed with an unspecified mood disorder/affective mood disorder but no bipolar disorder that they are aware of. She has been prescribed hydroxyzine in the past for anxiety but that is it as far as their providers are concerned.  Farrel Gordon, DO

## 2022-04-11 NOTE — TOC Initial Note (Signed)
Transition of Care [facility]) - Initial/Assessment Note    Patient Details  Name: Darlene King MRN: [id number] Date of Birth: [birth year]  Transition of Care [facility]) CM/SW Contact:    Curlene Labrum, RN Phone Number: 04/11/2022, 11:50 AM  Clinical Narrative:                 CM met with the patient at the bedside to discuss needs for OP counseling for substance abuse - considering patient's admission for alcoholic ketoacidosis.  The patient states that she is a current Ship broker (juniot) at [employer] and states that she needs help find helping coping skills to handle stress.  OP counseling resounces provided at the bedside.  The patient states that she currently uses alcohol, marijuana and vaping.  She states that she has been communicating with her parents and they are aware of her hospitalization.  The patient drives and transportation resources are not an issue at this time.  No other needs at this time - CM will continue to follow the patient for TOC needs to return to home.  Expected Discharge Plan: Home/Self Care Barriers to Discharge: Continued Medical Work up   Patient Goals and CMS Choice Patient states their goals for this hospitalization and ongoing recovery are:: Wants to feel better and seek help for alcohol abuse and "better coping" skills with stress CMS Medicare.gov Compare Post Acute Care list provided to:: Patient Choice offered to / list presented to : Patient      Expected Discharge Plan and Services   Discharge Planning Services: CM Consult, Other - See comment (Resources provided) Post Acute Care Choice: Resumption of Svcs/PTA Provider (Provided with OP resources for Substance abuse counseling) Living arrangements for the past 2 months: Apartment                                      Prior Living Arrangements/Services Living arrangements for the past 2 months: Apartment Lives with:: Roommate Patient language and need for interpreter  reviewed:: Yes Do you feel safe going back to the place where you live?: Yes      Need for Family Participation in Patient Care: Yes (Comment) Care giver support system in place?: Yes (comment)   Criminal Activity/Legal Involvement Pertinent to Current Situation/Hospitalization: No - Comment as needed  Activities of Daily Living Home Assistive Devices/Equipment: None ADL Screening (condition at time of admission) Patient's cognitive ability adequate to safely complete daily activities?: Yes Is the patient deaf or have difficulty hearing?: No Does the patient have difficulty seeing, even when wearing glasses/contacts?: No Does the patient have difficulty concentrating, remembering, or making decisions?: No Patient able to express need for assistance with ADLs?: No Does the patient have difficulty dressing or bathing?: No Independently performs ADLs?: No Communication: Independent Dressing (OT): Independent Grooming: Independent Feeding: Independent Bathing: Independent Toileting: Needs assistance Is this a change from baseline?: Change from baseline, expected to last >3days In/Out Bed: Independent Walks in Home: Independent Does the patient have difficulty walking or climbing stairs?: Yes Weakness of Legs: Both Weakness of Arms/Hands: None  Permission Sought/Granted Permission sought to share information with : Case Manager, Family Supports, Chartered certified accountant granted to share information with : Yes, Verbal Permission Granted     Permission granted to share info w AGENCY: OP counseling resources provided  Permission granted to share info w Relationship: family contacts     Emotional Assessment Appearance:: Appears  stated age Attitude/Demeanor/Rapport: Gracious Affect (typically observed): Accepting Orientation: : Oriented to Self, Oriented to Place, Oriented to  Time, Oriented to Situation Alcohol / Substance Use: Alcohol Use Psych Involvement: No  (comment)  Admission diagnosis:  Alcoholic ketoacidosis [T73.22] Alcohol use [Z78.9] Nausea and vomiting, unspecified vomiting type [R11.2] Patient Active Problem List   Diagnosis Date Noted   Alcoholic ketoacidosis 02/54/2706   Current moderate episode of major depressive disorder without prior episode ([place])    Nausea and vomiting 05/28/2019   Nausea & vomiting 05/27/2019   Normocytic anemia 05/27/2019   PCP:  Patient, No Pcp Per Pharmacy:   [facility] [address] [facility] [place], [place] [address] Phone: [phone] Fax: [phone]     Social Determinants of Health (SDOH) Social History: SDOH Screenings   Food Insecurity: No Food Insecurity (04/11/2022)  Housing: Low Risk  (04/11/2022)  Transportation Needs: Unmet Transportation Needs (04/11/2022)  Utilities: Not At Risk (04/11/2022)  Tobacco Use: Low Risk  (04/11/2022)   SDOH Interventions: Housing Interventions: Intervention Not Indicated   Readmission Risk Interventions     No data to display

## 2022-04-11 NOTE — ED Notes (Signed)
Pt ambulated to restroom without incident. When pt returned she stated she vomited d/t movement. Pt provided clear beverage and crackers

## 2022-04-11 NOTE — ED Notes (Signed)
ED TO INPATIENT HANDOFF REPORT  ED Nurse Name and Phone #: Armanda Heritage emtp [phone]  S Name/Age/Gender Darlene King 22 y.o. female Room/Bed: 006C/006C  Code Status   Code Status: Full Code  Home/SNF/Other Home Patient oriented to: self, place, time, and situation Is this baseline? Yes   Triage Complete: Triage complete  Chief Complaint Alcoholic ketoacidosis [E87.29]  Triage Note Pt states I think I have alcohol poisoning I drank almost 2 pints of rum last night and have been throwing up all day. Complains of headache. Pt vomiting in triage. Pt states she drinks daily about half a pint a day.    Allergies No Known Allergies  Level of Care/Admitting Diagnosis ED Disposition     ED Disposition  Admit   Condition  --   Comment  Hospital Area: [facility] [[id number]]  Level of Care: Med-Surg [16]  May place patient in observation at [facility] or [facility] if equivalent level of care is available:: No  Covid Evaluation: Asymptomatic - no recent exposure (last 10 days) testing not required  Diagnosis: Alcoholic ketoacidosis [937169]  Admitting Physician: Ginnie Smart [[id number]]  Attending Physician: HATCHER, JEFFREY C [[id number]]          B Medical/Surgery History No past medical history on file. Past Surgical History:  Procedure Laterality Date   TONSILLECTOMY       A IV Location/Drains/Wounds Patient Lines/Drains/Airways Status     Active Line/Drains/Airways     Name Placement date Placement time Site Days   Peripheral IV 04/11/22 22 G Right Antecubital 04/11/22  0210  Antecubital  less than 1            Intake/Output Last 24 hours  Intake/Output Summary (Last 24 hours) at 04/11/2022 6789 Last data filed at 04/11/2022 3810 Gross per 24 hour  Intake 2465.7 ml  Output --  Net 2465.7 ml    Labs/Imaging Results for orders placed or performed during the hospital encounter of 04/10/22 (from the past 48 hour(s))  Ethanol      Status: Abnormal   Collection Time: 04/10/22  8:46 PM  Result Value Ref Range   Alcohol, Ethyl (B) 69 (H) <10 mg/dL    Comment: (NOTE) Lowest detectable limit for serum alcohol is 10 mg/dL.  For medical purposes only. Performed at [facility], [address] [place], [place] [address]   Comprehensive metabolic panel     Status: Abnormal   Collection Time: 04/10/22  8:46 PM  Result Value Ref Range   Sodium 136 135 - 145 mmol/L   Potassium 4.3 3.5 - 5.1 mmol/L   Chloride 101 98 - 111 mmol/L   CO2 9 (L) 22 - 32 mmol/L   Glucose, Bld 57 (L) 70 - 99 mg/dL    Comment: Glucose reference range applies only to samples taken after fasting for at least 8 hours.   BUN 8 6 - 20 mg/dL   Creatinine, Ser 2.58 0.44 - 1.00 mg/dL   Calcium 9.7 8.9 - 52.7 mg/dL   Total Protein 8.1 6.5 - 8.1 g/dL   Albumin 4.4 3.5 - 5.0 g/dL   AST 782 (H) 15 - 41 U/L   ALT 69 (H) 0 - 44 U/L   Alkaline Phosphatase 72 38 - 126 U/L   Total Bilirubin 0.6 0.3 - 1.2 mg/dL   GFR, Estimated >42 >35 mL/min    Comment: (NOTE) Calculated using the CKD-EPI Creatinine Equation (2021)    Anion gap 26 (H) 5 -  15    Comment: ELECTROLYTES REPEATED TO VERIFY Performed at [facility], [place] [address] [place], [place] [address]   CBC with Differential     Status: Abnormal   Collection Time: 04/10/22  8:46 PM  Result Value Ref Range   WBC 15.6 (H) 4.0 - 10.5 K/uL   RBC 5.33 (H) 3.87 - 5.11 MIL/uL   Hemoglobin 13.6 12.0 - 15.0 g/dL   HCT 43.3 36.0 - 46.0 %   MCV 81.2 80.0 - 100.0 fL   MCH 25.5 (L) 26.0 - 34.0 pg   MCHC 31.4 30.0 - 36.0 g/dL   RDW 15.4 11.5 - 15.5 %   Platelets 435 (H) 150 - 400 K/uL   nRBC 0.0 0.0 - 0.2 %   Neutrophils Relative % 90 %   Neutro Abs 14.0 (H) 1.7 - 7.7 K/uL   Lymphocytes Relative 6 %   Lymphs Abs 0.9 0.7 - 4.0 K/uL   Monocytes Relative 3 %   Monocytes Absolute 0.5 0.1 - 1.0 K/uL   Eosinophils Relative 0 %   Eosinophils Absolute 0.0 0.0 - 0.5 K/uL   Basophils Relative 0 %    Basophils Absolute 0.0 0.0 - 0.1 K/uL   Immature Granulocytes 1 %   Abs Immature Granulocytes 0.10 (H) 0.00 - 0.07 K/uL    Comment: Performed at [facility] [address], [place], [place] [address]  Troponin I (High Sensitivity)     Status: None   Collection Time: 04/10/22  8:46 PM  Result Value Ref Range   Troponin I (High Sensitivity) 4 <18 ng/L    Comment: (NOTE) Elevated high sensitivity troponin I (hsTnI) values and significant  changes across serial measurements may suggest ACS but many other  chronic and acute conditions are known to elevate hsTnI results.  Refer to the "Links" section for chest pain algorithms and additional  guidance. Performed at [facility], [place] [address] [place], [place] [address]   Magnesium     Status: None   Collection Time: 04/10/22  8:46 PM  Result Value Ref Range   Magnesium 1.9 1.7 - 2.4 mg/dL    Comment: Performed at [facility] [address] [place], [place] [address]  Salicylate level     Status: Abnormal   Collection Time: 04/10/22  8:46 PM  Result Value Ref Range   Salicylate Lvl <6.8 (L) 7.0 - 30.0 mg/dL    Comment: Performed at [facility] [address], [place], [place] [address]  I-Stat beta hCG blood, ED     Status: None   Collection Time: 04/10/22  9:35 PM  Result Value Ref Range   I-stat hCG, quantitative <5.0 <5 mIU/mL   Comment 3            Comment:   GEST. AGE      CONC.  (mIU/mL)   <=1 WEEK        5 - 50     2 WEEKS       50 - 500     3 WEEKS       100 - 10,000     4 WEEKS     1,000 - 30,000        FEMALE AND NON-PREGNANT FEMALE:     LESS THAN 5 mIU/mL   Troponin I (High Sensitivity)     Status: None   Collection Time: 04/10/22 11:07 PM  Result Value Ref Range   Troponin I (High  Sensitivity) 6 <18 ng/L    Comment: (NOTE) Elevated high sensitivity troponin I (hsTnI) values and significant  changes across serial measurements may suggest ACS but many other  chronic and acute  conditions are known to elevate hsTnI results.  Refer to the "Links" section for chest pain algorithms and additional  guidance. Performed at [facility], [address] [place], [place] [address]   Lipase, blood     Status: None   Collection Time: 04/10/22 11:07 PM  Result Value Ref Range   Lipase 27 11 - 51 U/L    Comment: Performed at [facility], [address], [place], [place] [address]  Blood gas, venous (at [facility] and [facility])     Status: Abnormal   Collection Time: 04/11/22  2:30 AM  Result Value Ref Range   pH, Ven 7.1 (LL) 7.25 - 7.43    Comment: CRITICAL RESULT CALLED TO, READ BACK BY AND VERIFIED WITH:  B. GRIFFITH, RN, 0301, 04/11/22, EADEDOKUN    pCO2, Ven 25 (L) 44 - 60 mmHg   pO2, Ven 72 (H) 32 - 45 mmHg   Bicarbonate 7.8 (L) 20.0 - 28.0 mmol/L   Acid-base deficit 20.4 (H) 0.0 - 2.0 mmol/L   O2 Saturation 95.3 %   Patient temperature 36.7    Collection site RIGHT ANTECUBITAL     Comment: Performed at [facility], [address] [place], [place] [address]  CBG monitoring, ED     Status: None   Collection Time: 04/11/22  3:40 AM  Result Value Ref Range   Glucose-Capillary 97 70 - 99 mg/dL    Comment: Glucose reference range applies only to samples taken after fasting for at least 8 hours.  Lactic acid, plasma     Status: Abnormal   Collection Time: 04/11/22  5:00 AM  Result Value Ref Range   Lactic Acid, Venous 5.8 (HH) 0.5 - 1.9 mmol/L    Comment: CRITICAL RESULT CALLED TO, READ BACK BY AND VERIFIED WITH Edyth Gunnels GRIFFITH RN 04/11/22 [address] Performed at [facility], [address] [place], [place] [address]   Basic metabolic panel     Status: Abnormal   Collection Time: 04/11/22  5:00 AM  Result Value Ref Range   Sodium 133 (L) 135 - 145 mmol/L   Potassium 5.2 (H) 3.5 - 5.1 mmol/L   Chloride 104 98 - 111 mmol/L   CO2 11 (L) 22 - 32 mmol/L   Glucose, Bld 91 70 - 99 mg/dL    Comment: Glucose reference range applies only to  samples taken after fasting for at least 8 hours.   BUN 7 6 - 20 mg/dL   Creatinine, Ser 8.78 0.44 - 1.00 mg/dL   Calcium 9.4 8.9 - 67.6 mg/dL   GFR, Estimated >72 >09 mL/min    Comment: (NOTE) Calculated using the CKD-EPI Creatinine Equation (2021)    Anion gap 18 (H) 5 - 15    Comment: Performed at [facility], [address] [place] [place], [place] [address]   CT ABDOMEN PELVIS W CONTRAST  Result Date: 04/11/2022 CLINICAL DATA:  Epigastric pain. EXAM: CT ABDOMEN AND PELVIS WITH CONTRAST TECHNIQUE: Multidetector CT imaging of the abdomen and pelvis was performed using the standard protocol following bolus administration of intravenous contrast. RADIATION DOSE REDUCTION: This exam was performed according to the departmental dose-optimization program which includes automated exposure control, adjustment of the mA and/or kV according to patient size and/or use of  iterative reconstruction technique. CONTRAST:  81mL OMNIPAQUE IOHEXOL 350 MG/ML SOLN COMPARISON:  CT with IV contrast 05/27/2019. FINDINGS: Lower chest: No abnormality. Hepatobiliary: Enlarged, measuring 19 cm length with increased diffuse moderate to severe steatosis. Correlate clinically for steatohepatitis. There is no mass enhancement. The gallbladder and bile ducts are unremarkable. Pancreas: No abnormality. Spleen: No abnormality.  No splenomegaly. Adrenals/Urinary Tract: There is no adrenal or renal mass. There is contrast in the collecting systems which could obscure stones. No ureteral stones or hydronephrosis are seen. The bladder thickness is normal. Stomach/Bowel: The stomach is moderately fluid distended and there is a slightly dilated descending duodenum. There is a shortened distance between the abdominal aorta and SMA at the level of the third segment of duodenum, measuring only 5 mm and may predispose to SMA compression syndrome and impaired gastric emptying. No outlet obstructing mass is seen. Remainder of the small bowel is  normal caliber. An appendix is not seen in this patient and could be surgically absent or obscured by overlapping structures. There is wall thickening versus nondistention in the ascending, transverse and descending colon. There are sigmoid diverticula without appreciable acute diverticulitis. Vascular/Lymphatic: No significant vascular findings are present. No enlarged abdominal or pelvic lymph nodes. Reproductive: Uterus and bilateral adnexa are unremarkable. Other: Multiple pelvic phleboliths on the left. There is no free air, free fluid, free hemorrhage, abscess or incarcerated hernia. Musculoskeletal: No acute or significant osseous findings. IMPRESSION: 1. Moderately fluid distended stomach with slightly dilated descending duodenum. There is a shortened distance between the abdominal aorta and SMA of only 5 mm and may predispose to SMA duodenal compression syndrome and impaired gastric emptying. No outlet obstructing mass is seen. 2. Colitis versus nondistention of the ascending, transverse and descending colon. 3. Enlarged and moderately to severely steatotic liver. Correlate clinically for steatohepatitis. 4. Sigmoid diverticulosis without evidence of diverticulitis. Electronically Signed   By: Telford Nab M.D.   On: 04/11/2022 04:13   DG Chest 2 View  Result Date: 04/10/2022 CLINICAL DATA:  Chest pain. EXAM: CHEST - 2 VIEW COMPARISON:  None Available. FINDINGS: The heart size and mediastinal contours are within normal limits. Both lungs are clear. The visualized skeletal structures are unremarkable. IMPRESSION: No active cardiopulmonary disease. Electronically Signed   By: Virgina Norfolk M.D.   On: 04/10/2022 21:23    Pending Labs Unresulted Labs (From admission, onward)     Start     Ordered   04/12/22 0500  HIV Antibody (routine testing w rflx)  (HIV Antibody (Routine testing w reflex) panel)  Tomorrow morning,   R        04/11/22 0917   04/11/22 0445  Lactic acid, plasma  Now then every  2 hours,   R (with STAT occurrences)      04/11/22 0444   04/11/22 0157  Urinalysis, Routine w reflex microscopic  Once,   URGENT        04/11/22 0156   04/11/22 0157  Urine rapid drug screen (hosp performed)  Once,   STAT        04/11/22 0156            Vitals/Pain Today's Vitals   04/11/22 0800 04/11/22 0813 04/11/22 0828 04/11/22 0900  BP: 131/82   (!) 140/83  Pulse: (!) 113   (!) 112  Resp: 16   16  Temp:      TempSrc:      SpO2: 100%   100%  Weight:      Height:  PainSc: 0-No pain 0-No pain 0-No pain 0-No pain    Isolation Precautions No active isolations  Medications Medications  LORazepam (ATIVAN) tablet 1 mg (1 mg Oral Not Given 04/11/22 0912)    Or  LORazepam (ATIVAN) tablet 0-4 mg ( Oral See Alternative 04/11/22 0912)  LORazepam (ATIVAN) tablet 0-4 mg (has no administration in time range)  thiamine (VITAMIN B1) tablet 100 mg (has no administration in time range)    Or  thiamine (VITAMIN B1) injection 100 mg (has no administration in time range)  rivaroxaban (XARELTO) tablet 10 mg (has no administration in time range)  HYDROmorphone (DILAUDID) injection 0.5 mg (has no administration in time range)  ondansetron (ZOFRAN) injection 4 mg (has no administration in time range)  folic acid (FOLVITE) tablet 1 mg (has no administration in time range)  ondansetron (ZOFRAN-ODT) disintegrating tablet 4 mg (4 mg Oral Given 04/10/22 2100)  sodium chloride 0.9 % bolus 1,000 mL (0 mLs Intravenous Stopped 04/11/22 0414)  ondansetron (ZOFRAN) injection 4 mg (4 mg Intravenous Given 04/11/22 0220)  HYDROmorphone (DILAUDID) injection 1 mg (1 mg Intravenous Given 04/11/22 0215)  sodium chloride 0.9 % bolus 1,000 mL (0 mLs Intravenous Stopped 04/11/22 0414)  iohexol (OMNIPAQUE) 350 MG/ML injection 65 mL (65 mLs Intravenous Contrast Given 04/11/22 0333)  lactated ringers bolus 1,000 mL (0 mLs Intravenous Stopped 04/11/22 0651)  metoCLOPramide (REGLAN) injection 10 mg (10 mg Intravenous  Given 04/11/22 0459)  HYDROmorphone (DILAUDID) injection 1 mg (1 mg Intravenous Given 04/11/22 0515)    Mobility walks       R Recommendations: See Admitting Provider Note  Report given to:   Additional Notes:    none, patient knows she needs to provide a urine when able

## 2022-04-11 NOTE — ED Provider Notes (Signed)
Care of patient handed off to me by Deno Etienne, PA-C at change of shift.  Briefly this is a 22 year old female with past medical history of depression, nausea and vomiting who presents to the emergency department with alcohol intoxication.  States that she drank 2 pints of rum yesterday and thinks that she has alcohol poisoning.  She has since had uncontrolled nausea and vomiting, abdominal pain.  Physical Exam  BP 118/66   Pulse (!) 104   Temp 98 F (36.7 C) (Oral)   Resp 17   Ht 5\' 4"  (1.626 m)   Wt 49.9 kg   SpO2 100%   BMI 18.88 kg/m   Physical Exam Vitals and nursing note reviewed.  HENT:     Head: Normocephalic and atraumatic.  Eyes:     General: No scleral icterus. Pulmonary:     Effort: Pulmonary effort is normal. No respiratory distress.  Skin:    Findings: No rash.  Neurological:     General: No focal deficit present.     Mental Status: She is alert.  Psychiatric:        Mood and Affect: Mood normal.        Behavior: Behavior normal.        Thought Content: Thought content normal.        Judgment: Judgment normal.     Procedures  Procedures  ED Course / MDM   Clinical Course as of 04/11/22 0916  Wed Apr 11, 2022  5631 Repaging surgery, medicine for consult and admission.  [LA]  4970 Spoke with Barkley Boards, PA-C. Will have surgeon read CT and give call back.  [LA]  [id number] Spoke with Dr. Collene Gobble, internal medicine who will admit patient.  [LA]    Clinical Course User Index [LA] Mickie Hillier, PA-C   Medical Decision Making Amount and/or Complexity of Data Reviewed Labs: ordered. Radiology: ordered.  Risk OTC drugs. Prescription drug management. Decision regarding hospitalization.   Workup with likely alcoholic ketoacidosis.  Initial anion gap of 26 which is since decreased to 18 after fluid resuscitation, lactic is 5.8, AST and ALT are consistent with alcohol use.  Patient was given pain medication with ongoing abdominal tenderness so was  sent for a CT abdomen pelvis.  This showed that she had SMA concern for possible duodenal compression syndrome.  Lactic was added on which was 5.8.  This is likely related to dehydration but because they could not fully rule out ischemic bowel, general surgery was consulted.  Abdominal pain is since improved.  Consulted and spoke with Barkley Boards, PA-C with general surgery. Will have surgeon read CT given abnormal findings but lower suspicion for SMA syndrome at this time.  Consulted and spoke with Dr. Collene Gobble, internal medicine who agrees to admit patient.        Mickie Hillier, PA-C 04/11/22 2637    Valarie Merino, MD 04/11/22 [phone]

## 2022-04-11 NOTE — TOC CAGE-AID Note (Addendum)
Transition of Care (TOC) - CAGE-AID Screening OP counseling resources provided to the patient at the bedside.  I highlighted Outpatient counseling centers in the [facility] that are covered by her private insurance provider.  Resources for Cessation from Vaping also included in the discharge instructions.  Patient Details  Name: Darlene King MRN: [id number] Date of Birth: 02-Jan-2001  Transition of Care [facility]) CM/SW Contact:    Curlene Labrum, RN Phone Number: 04/11/2022, 11:54 AM   Clinical Narrative:    CAGE-AID Screening:    Have You Ever Felt You Ought to Cut Down on Your Drinking or Drug Use?: Yes Have People Annoyed You By Critizing Your Drinking Or Drug Use?: Yes Have You Felt Bad Or Guilty About Your Drinking Or Drug Use?: Yes Have You Ever Had a Drink or Used Drugs First Thing In The Morning to Steady Your Nerves or to Get Rid of a Hangover?: Yes CAGE-AID Score: 4  Substance Abuse Education Offered: Yes  Substance abuse interventions: Patient Counseling, Educational Materials

## 2022-04-11 NOTE — ED Provider Notes (Signed)
[place] EMERGENCY DEPARTMENT AT [facility] Provider Note   CSN: [id number] Arrival date & time: 04/10/22  1912     History  Chief Complaint  Patient presents with   Alcohol Intoxication    Darlene King is a 22 y.o. female.  HPI   Without significant medical history presents with complaints of nausea vomiting.  Patient states this started yesterday morning, states that she believes that she has alcohol poisoning.  Patient states that she drank 2 pints of rum, she states that the following morning she had uncontrolled nausea vomiting, unable to tolerate p.o., states that she has stomach pain only with vomiting, she denies bloody emesis or coffee-ground emesis, still passing gas having normal bowel movements, denies any urinary symptoms.  She states that she had a rough week typically does not drink this much, she denies any suicidal or homicidal ideations, she is now endorsing some back pain, states that started while she is here, states is mainly lower back, remains constant, no recent trauma, never had this in the past.    Home Medications Prior to Admission medications   Medication Sig Start Date End Date Taking? Authorizing Provider  medroxyPROGESTERone (DEPO-PROVERA) 150 MG/ML injection Inject 150 mg into the muscle every 3 (three) months. 01/30/22  Yes [provider]  doxycycline (VIBRA-TABS) 100 MG tablet Take 100 mg by mouth 2 (two) times daily. Patient not taking: Reported on 04/11/2022    [provider]  lamoTRIgine Starter Kit-Orange 42 x 25 MG & 7 x 100 MG KIT Take by mouth daily. Patient not taking: Reported on 04/11/2022 02/15/22   [provider]  metroNIDAZOLE (FLAGYL) 500 MG tablet Take 500 mg by mouth 2 (two) times daily. Patient not taking: Reported on 04/11/2022 04/06/22   [provider]  naltrexone (DEPADE) 50 MG tablet Take 50 mg by mouth daily. Patient not taking: Reported on 04/11/2022 02/15/22   [provider]  traZODone (DESYREL) 50 MG tablet Take 50-100 mg by mouth at bedtime as needed for sleep. Patient not taking: Reported on 04/11/2022 02/15/22   [provider]  VRAYLAR 1.5 MG capsule Take 1.5 mg by mouth daily. Patient not taking: Reported on 04/11/2022 02/15/22   [provider]      Allergies    Patient has no known allergies.    Review of Systems   Review of Systems  Constitutional:  Negative for chills and fever.  Respiratory:  Negative for shortness of breath.   Cardiovascular:  Negative for chest pain.  Gastrointestinal:  Positive for abdominal pain, nausea and vomiting. Negative for diarrhea.  Neurological:  Negative for headaches.    Physical Exam Updated Vital Signs BP 118/66   Pulse (!) 104   Temp 98 F (36.7 C) (Oral)   Resp 17   Ht 5\' 4"  (1.626 m)   Wt 49.9 kg   SpO2 100%   BMI 18.88 kg/m  Physical Exam Vitals and nursing note reviewed.  Constitutional:      General: She is not in acute distress.    Appearance: She is not ill-appearing.  HENT:     Head: Normocephalic and atraumatic.     Nose: No congestion.  Eyes:     Conjunctiva/sclera: Conjunctivae normal.  Cardiovascular:     Rate and Rhythm: Regular rhythm. Tachycardia present.     Pulses: Normal pulses.     Heart sounds: No murmur heard.    No friction rub. No gallop.  Pulmonary:     Effort:  No respiratory distress.     Breath sounds: No wheezing, rhonchi or rales.  Abdominal:     Palpations: Abdomen is soft.     Tenderness: There is abdominal tenderness. There is no right CVA tenderness or left CVA tenderness.     Comments: Abdomen nondistended, soft, she has noted tenderness in the epigastric region without guarding contents or peritoneal sign negative Murphy sign McBurney point she had no CVA tenderness or flank tenderness.  Musculoskeletal:     Comments: Spine was palpated was nontender to palpation no step-off deformities noted, there is no overlying skin  changes.  She is moving her upper and lower extremity without difficulty.  Skin:    General: Skin is warm and dry.  Neurological:     Mental Status: She is alert.  Psychiatric:        Mood and Affect: Mood normal.     ED Results / Procedures / Treatments   Labs (all labs ordered are listed, but only abnormal results are displayed) Labs Reviewed  ETHANOL - Abnormal; Notable for the following components:      Result Value   Alcohol, Ethyl (B) 69 (*)    All other components within normal limits  COMPREHENSIVE METABOLIC PANEL - Abnormal; Notable for the following components:   CO2 9 (*)    Glucose, Bld 57 (*)    AST 139 (*)    ALT 69 (*)    Anion gap 26 (*)    All other components within normal limits  CBC WITH DIFFERENTIAL/PLATELET - Abnormal; Notable for the following components:   WBC 15.6 (*)    RBC 5.33 (*)    MCH 25.5 (*)    Platelets 435 (*)    Neutro Abs 14.0 (*)    Abs Immature Granulocytes 0.10 (*)    All other components within normal limits  BLOOD GAS, VENOUS - Abnormal; Notable for the following components:   pH, Ven 7.1 (*)    pCO2, Ven 25 (*)    pO2, Ven 72 (*)    Bicarbonate 7.8 (*)    Acid-base deficit 20.4 (*)    All other components within normal limits  SALICYLATE LEVEL - Abnormal; Notable for the following components:   Salicylate Lvl <3.2 (*)    All other components within normal limits  LACTIC ACID, PLASMA - Abnormal; Notable for the following components:   Lactic Acid, Venous 5.8 (*)    All other components within normal limits  BASIC METABOLIC PANEL - Abnormal; Notable for the following components:   Sodium 133 (*)    Potassium 5.2 (*)    CO2 11 (*)    Anion gap 18 (*)    All other components within normal limits  MAGNESIUM  LIPASE, BLOOD  URINALYSIS, ROUTINE W REFLEX MICROSCOPIC  RAPID URINE DRUG SCREEN, HOSP PERFORMED  LACTIC ACID, PLASMA  CBG MONITORING, ED  I-STAT BETA HCG BLOOD, ED ([facility], [facility], [facility] ONLY)  CBG MONITORING, ED  TROPONIN I  (HIGH SENSITIVITY)  TROPONIN I (HIGH SENSITIVITY)    EKG None  Radiology CT ABDOMEN PELVIS W CONTRAST  Result Date: 04/11/2022 CLINICAL DATA:  Epigastric pain. EXAM: CT ABDOMEN AND PELVIS WITH CONTRAST TECHNIQUE: Multidetector CT imaging of the abdomen and pelvis was performed using the standard protocol following bolus administration of intravenous contrast. RADIATION DOSE REDUCTION: This exam was performed according to the departmental dose-optimization program which includes automated exposure control, adjustment of the mA and/or kV according to patient size and/or use of iterative reconstruction technique.  CONTRAST:  81mL OMNIPAQUE IOHEXOL 350 MG/ML SOLN COMPARISON:  CT with IV contrast 05/27/2019. FINDINGS: Lower chest: No abnormality. Hepatobiliary: Enlarged, measuring 19 cm length with increased diffuse moderate to severe steatosis. Correlate clinically for steatohepatitis. There is no mass enhancement. The gallbladder and bile ducts are unremarkable. Pancreas: No abnormality. Spleen: No abnormality.  No splenomegaly. Adrenals/Urinary Tract: There is no adrenal or renal mass. There is contrast in the collecting systems which could obscure stones. No ureteral stones or hydronephrosis are seen. The bladder thickness is normal. Stomach/Bowel: The stomach is moderately fluid distended and there is a slightly dilated descending duodenum. There is a shortened distance between the abdominal aorta and SMA at the level of the third segment of duodenum, measuring only 5 mm and may predispose to SMA compression syndrome and impaired gastric emptying. No outlet obstructing mass is seen. Remainder of the small bowel is normal caliber. An appendix is not seen in this patient and could be surgically absent or obscured by overlapping structures. There is wall thickening versus nondistention in the ascending, transverse and descending colon. There are sigmoid diverticula without appreciable acute diverticulitis.  Vascular/Lymphatic: No significant vascular findings are present. No enlarged abdominal or pelvic lymph nodes. Reproductive: Uterus and bilateral adnexa are unremarkable. Other: Multiple pelvic phleboliths on the left. There is no free air, free fluid, free hemorrhage, abscess or incarcerated hernia. Musculoskeletal: No acute or significant osseous findings. IMPRESSION: 1. Moderately fluid distended stomach with slightly dilated descending duodenum. There is a shortened distance between the abdominal aorta and SMA of only 5 mm and may predispose to SMA duodenal compression syndrome and impaired gastric emptying. No outlet obstructing mass is seen. 2. Colitis versus nondistention of the ascending, transverse and descending colon. 3. Enlarged and moderately to severely steatotic liver. Correlate clinically for steatohepatitis. 4. Sigmoid diverticulosis without evidence of diverticulitis. Electronically Signed   By: Telford Nab M.D.   On: 04/11/2022 04:13   DG Chest 2 View  Result Date: 04/10/2022 CLINICAL DATA:  Chest pain. EXAM: CHEST - 2 VIEW COMPARISON:  None Available. FINDINGS: The heart size and mediastinal contours are within normal limits. Both lungs are clear. The visualized skeletal structures are unremarkable. IMPRESSION: No active cardiopulmonary disease. Electronically Signed   By: Virgina Norfolk M.D.   On: 04/10/2022 21:23    Procedures .Critical Care  Performed by: Marcello Fennel, PA-C Authorized by: Marcello Fennel, PA-C   Critical care provider statement:    Critical care time (minutes):  60   Critical care was necessary to treat or prevent imminent or life-threatening deterioration of the following conditions:  Metabolic crisis   Critical care was time spent personally by me on the following activities:  Discussions with consultants, evaluation of patient's response to treatment, examination of patient, ordering and review of laboratory studies, ordering and review of  radiographic studies, ordering and performing treatments and interventions, pulse oximetry, re-evaluation of patient's condition and review of old charts   I assumed direction of critical care for this patient from another provider in my specialty: no     Care discussed with: admitting provider       Medications Ordered in ED Medications  LORazepam (ATIVAN) tablet 1 mg (1 mg Oral Not Given 04/11/22 0630)    Or  LORazepam (ATIVAN) tablet 0-4 mg ( Oral See Alternative 04/11/22 0630)  LORazepam (ATIVAN) tablet 0-4 mg (has no administration in time range)  thiamine (VITAMIN B1) tablet 100 mg (has no administration in time range)  Or  thiamine (VITAMIN B1) injection 100 mg (has no administration in time range)  ondansetron (ZOFRAN-ODT) disintegrating tablet 4 mg (4 mg Oral Given 04/10/22 2100)  sodium chloride 0.9 % bolus 1,000 mL (0 mLs Intravenous Stopped 04/11/22 0414)  ondansetron (ZOFRAN) injection 4 mg (4 mg Intravenous Given 04/11/22 0220)  HYDROmorphone (DILAUDID) injection 1 mg (1 mg Intravenous Given 04/11/22 0215)  sodium chloride 0.9 % bolus 1,000 mL (0 mLs Intravenous Stopped 04/11/22 0414)  iohexol (OMNIPAQUE) 350 MG/ML injection 65 mL (65 mLs Intravenous Contrast Given 04/11/22 0333)  lactated ringers bolus 1,000 mL (0 mLs Intravenous Stopped 04/11/22 0651)  metoCLOPramide (REGLAN) injection 10 mg (10 mg Intravenous Given 04/11/22 0459)  HYDROmorphone (DILAUDID) injection 1 mg (1 mg Intravenous Given 04/11/22 0515)    ED Course/ Medical Decision Making/ A&P                             Medical Decision Making Amount and/or Complexity of Data Reviewed Labs: ordered. Radiology: ordered.  Risk OTC drugs. Prescription drug management.   This patient presents to the ED for concern of nausea vomiting, this involves an extensive number of treatment options, and is a complaint that carries with it a high risk of complications and morbidity.  The differential diagnosis includes bowel  obstruction, volvulus, diverticulitis, gastritis, pancreatitis    Additional history obtained:  Additional history obtained from N/A External records from outside source obtained and reviewed including recent ER notes   Co morbidities that complicate the patient evaluation  N/A  Social Determinants of Health:  No primary care provider    Lab Tests:  I Ordered, and personally interpreted labs.  The pertinent results include: CBC shows leukocytosis of 15.6, CMP shows CO2 of 9, glucose 57, AST of 139 and ALT of 69 and gap 26, ethanol 69, salicylate level less than 7, negative delta troponin, lipase 27, i-STAT hCG less than 5 i-STAT VBG reveals pH of 7.1 bicarb of 7.8   Imaging Studies ordered:  I ordered imaging studies including chest x-ray, CT abdomen pelvis I independently visualized and interpreted imaging which showed chest x-ray unremarkable, CT scan reveals possible SMA without evidence of bowel outlet obstruction, colitis, and steatotic  I agree with the radiologist interpretation   Cardiac Monitoring:  The patient was maintained on a cardiac monitor.  I personally viewed and interpreted the cardiac monitored which showed an underlying rhythm of: Sinus tach without signs of ischemia   Medicines ordered and prescription drug management:  I ordered medication including fluids I have reviewed the patients home medicines and have made adjustments as needed  Critical Interventions:  Patient has evidence of alcoholic ketoacidosis, will start her on fluids, provide her with thiamine, reassess Patient was given 2 L of fluids, still pain medication, states she is feeling comfortable, will continue to monitor Repeat BMP shows improving anion gap as well as bicarb.   Reevaluation:  Presents with nausea vomiting triage obtain basic lab work imaging which I personally reviewed concerning for alcoholic ketoacidosis, will add on VBG saliclate level and reassess  Patient  despite pain medication states she is still having back pain I reassessed her abdomen is still tender, will send down for CT ab pelvis for further evaluation  CT imaging shows SMA concern for possible duodenal compression syndrome will add on a lactic to rule out possibility of bowel ischemia  Lactic elevated at 5.8, likely dehydration but can't fully out ischemic bowel, will  consult with general surgery for further recommendations.  Consultations Obtained:  Pending     Test Considered:  N/a    Rule out I have low suspicion for ACS as history is atypical, patient has no cardiac history, EKG was sinus rhythm without signs of ischemia, patient had negative delta troponin.  Low suspicion for PE as patient denies pleuritic chest pain, shortness of breath, patient denies leg pain, no pedal edema noted on exam, vital signs reassuring afebrile nontachypneic nonhypoxic.  She is tachycardic but I suspect this is secondary due to alcohol ketoacidosis.  Low suspicion for AAA or aortic dissection as history is atypical, patient has low risk factors CT imaging is negative.  I doubt pancreatitis his lipase is negative.  I doubt biliary or hepatic abnormality as she has no significant elevation liver signs alk phos or T. bili.  Her liver enzymes are slightly elevated by suspect this is secondary to alcohol consumption.  Suspicion for bowel obstruction, volvulus, perforated stomach ulcer is low CT imaging is negative.  Suspicion for necrotic bowel is lower at this time as she has no abdominal pain at this time, she is nontoxic-appearing, she does have an elevated lactic as well as CT imaging concern for possible SMA but my suspicion leans more towards dehydration she has been able tolerate p.o. for last 24 hours-consistent vomiting and she currently has a nonsurgical abdomen after fluid resuscitation and pain medications.   Dispostion and problem list  After consideration of the diagnostic results and the  patients response to treatment, I feel that the patent would benefit from admission.  Due to shift change patient will be handed over to [facility]  Consult with general surgery for rule out of bowel ischemia and admit to medicine.   Alcoholic ketoacidosis-patient will need continued fluid hydration and close monitoring. Elevated liver enzymes-likely transient patient will likely need outpatient evaluation possible ultrasound. SMA-unclear significance patient will need further evaluation likely as an outpatient.           Final Clinical Impression(s) / ED Diagnoses Final diagnoses:  Alcoholic ketoacidosis  Nausea and vomiting, unspecified vomiting type  Alcohol use    Rx / DC Orders ED Discharge Orders     None         Marcello Fennel, PA-C 04/11/22 [id number]    Palumbo, April, MD 04/11/22 [phone]

## 2022-04-12 DIAGNOSIS — F3164 Bipolar disorder, current episode mixed, severe, with psychotic features: Secondary | ICD-10-CM | POA: Diagnosis present

## 2022-04-12 DIAGNOSIS — E8729 Other acidosis: Secondary | ICD-10-CM | POA: Diagnosis present

## 2022-04-12 DIAGNOSIS — K5731 Diverticulosis of large intestine without perforation or abscess with bleeding: Secondary | ICD-10-CM | POA: Diagnosis present

## 2022-04-12 DIAGNOSIS — R112 Nausea with vomiting, unspecified: Secondary | ICD-10-CM

## 2022-04-12 DIAGNOSIS — R1013 Epigastric pain: Secondary | ICD-10-CM

## 2022-04-12 DIAGNOSIS — E8809 Other disorders of plasma-protein metabolism, not elsewhere classified: Secondary | ICD-10-CM | POA: Diagnosis present

## 2022-04-12 DIAGNOSIS — K5900 Constipation, unspecified: Secondary | ICD-10-CM | POA: Diagnosis present

## 2022-04-12 DIAGNOSIS — F1020 Alcohol dependence, uncomplicated: Secondary | ICD-10-CM | POA: Diagnosis present

## 2022-04-12 DIAGNOSIS — K279 Peptic ulcer, site unspecified, unspecified as acute or chronic, without hemorrhage or perforation: Secondary | ICD-10-CM | POA: Diagnosis present

## 2022-04-12 DIAGNOSIS — D649 Anemia, unspecified: Secondary | ICD-10-CM | POA: Diagnosis present

## 2022-04-12 DIAGNOSIS — Z62810 Personal history of physical and sexual abuse in childhood: Secondary | ICD-10-CM | POA: Diagnosis not present

## 2022-04-12 DIAGNOSIS — R45851 Suicidal ideations: Secondary | ICD-10-CM | POA: Diagnosis present

## 2022-04-12 DIAGNOSIS — Y903 Blood alcohol level of 60-79 mg/100 ml: Secondary | ICD-10-CM | POA: Diagnosis present

## 2022-04-12 DIAGNOSIS — K709 Alcoholic liver disease, unspecified: Secondary | ICD-10-CM | POA: Diagnosis present

## 2022-04-12 DIAGNOSIS — E860 Dehydration: Secondary | ICD-10-CM | POA: Diagnosis present

## 2022-04-12 DIAGNOSIS — Z62811 Personal history of psychological abuse in childhood: Secondary | ICD-10-CM | POA: Diagnosis not present

## 2022-04-12 DIAGNOSIS — E876 Hypokalemia: Secondary | ICD-10-CM | POA: Diagnosis present

## 2022-04-12 DIAGNOSIS — Z818 Family history of other mental and behavioral disorders: Secondary | ICD-10-CM | POA: Diagnosis not present

## 2022-04-12 DIAGNOSIS — F17290 Nicotine dependence, other tobacco product, uncomplicated: Secondary | ICD-10-CM | POA: Diagnosis present

## 2022-04-12 DIAGNOSIS — K2990 Gastroduodenitis, unspecified, without bleeding: Secondary | ICD-10-CM | POA: Diagnosis present

## 2022-04-12 DIAGNOSIS — F419 Anxiety disorder, unspecified: Secondary | ICD-10-CM | POA: Diagnosis present

## 2022-04-12 DIAGNOSIS — G4700 Insomnia, unspecified: Secondary | ICD-10-CM | POA: Diagnosis present

## 2022-04-12 DIAGNOSIS — K7581 Nonalcoholic steatohepatitis (NASH): Secondary | ICD-10-CM | POA: Diagnosis present

## 2022-04-12 DIAGNOSIS — N898 Other specified noninflammatory disorders of vagina: Secondary | ICD-10-CM | POA: Diagnosis present

## 2022-04-12 DIAGNOSIS — R441 Visual hallucinations: Secondary | ICD-10-CM | POA: Diagnosis present

## 2022-04-12 DIAGNOSIS — R440 Auditory hallucinations: Secondary | ICD-10-CM | POA: Diagnosis present

## 2022-04-12 DIAGNOSIS — Z1152 Encounter for screening for COVID-19: Secondary | ICD-10-CM | POA: Diagnosis not present

## 2022-04-12 LAB — CBC
HCT: 37.9 % (ref 36.0–46.0)
Hemoglobin: 12.4 g/dL (ref 12.0–15.0)
MCH: 24.8 pg — ABNORMAL LOW (ref 26.0–34.0)
MCHC: 32.7 g/dL (ref 30.0–36.0)
MCV: 76 fL — ABNORMAL LOW (ref 80.0–100.0)
Platelets: 285 10*3/uL (ref 150–400)
RBC: 4.99 MIL/uL (ref 3.87–5.11)
RDW: 15.1 % (ref 11.5–15.5)
WBC: 7.7 10*3/uL (ref 4.0–10.5)
nRBC: 0 % (ref 0.0–0.2)

## 2022-04-12 LAB — COMPREHENSIVE METABOLIC PANEL
ALT: 77 U/L — ABNORMAL HIGH (ref 0–44)
AST: 129 U/L — ABNORMAL HIGH (ref 15–41)
Albumin: 3.7 g/dL (ref 3.5–5.0)
Alkaline Phosphatase: 49 U/L (ref 38–126)
Anion gap: 12 (ref 5–15)
BUN: 6 mg/dL (ref 6–20)
CO2: 18 mmol/L — ABNORMAL LOW (ref 22–32)
Calcium: 9.5 mg/dL (ref 8.9–10.3)
Chloride: 102 mmol/L (ref 98–111)
Creatinine, Ser: 0.92 mg/dL (ref 0.44–1.00)
GFR, Estimated: >60 mL/min (ref >60)
Glucose, Bld: 76 mg/dL (ref 70–99)
Potassium: 3.3 mmol/L — ABNORMAL LOW (ref 3.5–5.1)
Sodium: 132 mmol/L — ABNORMAL LOW (ref 135–145)
Total Bilirubin: 0.9 mg/dL (ref 0.3–1.2)
Total Protein: 6.9 g/dL (ref 6.5–8.1)

## 2022-04-12 LAB — MAGNESIUM: Magnesium: 2 mg/dL (ref 1.7–2.4)

## 2022-04-12 LAB — RPR: RPR Ser Ql: NONREACTIVE

## 2022-04-12 LAB — HIV ANTIBODY (ROUTINE TESTING W REFLEX): HIV Screen 4th Generation wRfx: NONREACTIVE

## 2022-04-12 LAB — PHOSPHORUS: Phosphorus: 1.2 mg/dL — ABNORMAL LOW (ref 2.5–4.6)

## 2022-04-12 MED ORDER — LORAZEPAM 1 MG PO TABS: 1.0000 mg | ORAL_TABLET | ORAL | Status: DC | PRN

## 2022-04-12 MED ORDER — PANTOPRAZOLE SODIUM 40 MG IV SOLR
40.0000 mg | Freq: Two times a day (BID) | INTRAVENOUS | Status: DC
  Administered 2022-04-12 – 2022-04-13 (×3): 40 mg via INTRAVENOUS
  Filled 2022-04-12 (×3): qty 10

## 2022-04-12 MED ORDER — POLYETHYLENE GLYCOL 3350 17 G PO PACK
17.0000 g | PACK | Freq: Every day | ORAL | Status: DC
  Administered 2022-04-12: 17 g via ORAL
  Filled 2022-04-12 (×2): qty 1

## 2022-04-12 MED ORDER — SUCRALFATE 1 G PO TABS
1.0000 g | ORAL_TABLET | Freq: Three times a day (TID) | ORAL | Status: DC
  Administered 2022-04-12 – 2022-04-13 (×5): 1 g via ORAL
  Filled 2022-04-12 (×4): qty 1

## 2022-04-12 MED ORDER — ADULT MULTIVITAMIN W/MINERALS CH
1.0000 | ORAL_TABLET | Freq: Every day | ORAL | Status: DC
  Administered 2022-04-12 – 2022-04-13 (×2): 1 via ORAL
  Filled 2022-04-12 (×2): qty 1

## 2022-04-12 MED ORDER — POTASSIUM CHLORIDE CRYS ER 20 MEQ PO TBCR
40.0000 meq | EXTENDED_RELEASE_TABLET | ORAL | Status: AC
  Administered 2022-04-12: 40 meq via ORAL
  Filled 2022-04-12: qty 2

## 2022-04-12 MED ORDER — SENNOSIDES-DOCUSATE SODIUM 8.6-50 MG PO TABS
1.0000 | ORAL_TABLET | Freq: Every day | ORAL | Status: DC
  Administered 2022-04-12: 1 via ORAL
  Filled 2022-04-12: qty 1

## 2022-04-12 MED ORDER — POTASSIUM PHOSPHATES 15 MMOLE/5ML IV SOLN
30.0000 mmol | Freq: Once | INTRAVENOUS | Status: AC
  Administered 2022-04-12: 30 mmol via INTRAVENOUS
  Filled 2022-04-12: qty 10

## 2022-04-12 NOTE — Plan of Care (Signed)

## 2022-04-12 NOTE — Hospital Course (Addendum)
Darlene King is a 22 y.o. with pmhx of alcohol use disorder, anxiety, MDD, and bipolar disorder who presents with abdominal pain, nausea, and vomiting after increased alcohol intake and was admitted for alcohol ketoacidosis.   #Alcohol ketoacidosis- resolved #Alcohol use disorder Patient presented to the ED with nausea, vomiting, and abdominal pain after drinking 1 pint of rum on 1/21 and 2 pints of rum on 1/22. She had reduced oral intake. In the ED, she was tachycardic to 110s but otherwise hemodynamically stable. Labs showed anion gap metabolic acidosis with lactic acid 5.8. She was given 1L of fluids in the ED. Patient was started on dextrose with LR after thiamine. She was continued on thiamine and folic acid. Her pain was controlled with dilaudid PRN. Nausea was controlled with Zofran PRN. Her gap anion metabolic acidosis was resolved on 1/25. She was started on CIWA protocol with Ativan which she did not require any as needed Ativan.   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 Surgery was consulted, recommending GI work-up. GI noting CT findings likely due to alcohol gastroduodenitis and did not see any reason for endoscopic evaluation during admission. Patient reports 3x melenic stools prior to admission. Last melenic stool on Sunday 1/21. No stools or melenic stools since admission. No concern for acute GI bleed given stable H/H. H/H on admission 12.7/40.9. This was followed throughout admission. Patient was started on PPI and carafate.    #Hepatosteatosis #LFT elevation CT abdomen noted steatotic liver. On admission AST/ALT 139/69. This is likely due to alcohol-induced liver injury given AST/ALT >1.5. LFTs were monitored throughout admission with a subsequent rise up to 347/247 and then a downtrend to 157/217  before discharge.   #UTI #Chlamydia #BV Patient seen 03/28/22 at Urgent Care. UA showed small leukocytes, trace blood, negative nitrites. Urine culture showed mixed urogenital flora <10K colonies. Student health reports patient was seen 04/09/22 and was also positive for BV and chlamydia but has not been taking medications. She was started on doxycycline 160m, metronidazole 5053m and nitrofurantoin 10036m RPR and HIV were negative.  GC/chlamydia was collected but did not result.. Her contraception is Depo-Provera injections, next dose in February. Given concern for antibiotics affecting pharmacokinetics of contraception, patient was advised to use second form of contraception at discharge.   #Anxiety #MDD #Bipolar disorder [place]. She was prescribed naltrexone, lamotrigine, vraylar, and quetiapine by her psychiatrist. She stopped taking medications due to alcohol use. She never started naltrexone. Patient endorses visual and auditory hallucinations of shadows and knocking at her door. This is a chronic problem since childhood and has been worse in the last year. Given the hallucinations occur mostly at night, could also be benign hypnagogic and hypnopompic hallucinations. Psychiatry was consulted for further recommendations.  After seeing the patient psychiatry [facility]  At that point patient was medically stable for discharge.

## 2022-04-12 NOTE — Consult Note (Addendum)
Referring Provider: Dr. Johny Blamer  Primary Care Physician:  Patient, No Pcp Per Primary Gastroenterologist: Althia Forts  Reason for Consultation:  N/V, abdominal pain   HPI: Darlene King is a 22 y.o. female with a past medical history of anxiety and depression.  She presented to the ED  She drank 1 bottle of wine on Sunday 04/08/2022 and 2 pints of rum on Monday night 04/09/2022. Tuesday 1/23, she developed nonstop vomiting, at least 12 episodes, without associated abdominal pain.  Emesis was described as green bilious liquid.  No coffee-ground emesis or frank hematemesis.  Labs in the ED showed a Ethyl alcohol level of 69.  Salicylate level < 7.  Urine drug screen ordered, not yet collected.  Sodium 136.  Potassium 4.3.  Creatinine 1.0.  BUN 82.  Glucose 57.  Total bili 0.5.  Alk phos 72.  AST 139.  ALT 69.  Magnesium 1.9.  WBC 15.6.  Hemoglobin 13.6.  Chest x-ray.  CTAP showed a moderately fluid-filled distended stomach with slightly dilated descending duodenum, colitis versus nondistention of the ascending, transverse and descending colon, sigmoid diverticulosis without evidence of diverticulitis and an enlarged moderately to severe steatotic liver.  Labs today: Sodium 132.  Potassium 3.3.  Bili 0.9.  Alk phos 49.  AST 129.  ALT 77.  WBC 7.7.  Hemoglobin 12.4.   A GI consult was requested for further evaluation regarding abdominal pain and elevated LFTs.  She endorses intermittent heavy alcohol use for the past 6 to 8 months.  She drinks 1/2 to 1 pint of rum most several days weekly which has been more consistent for the past 2 months.  She stated she drinks heavily when she is off her antianxiety and antidepressants which she discontinued 2 months ago.  She often drinks on an empty stomach.  She denies having any suicidal ideation in the past or at this time.  Maternal grandfather with history of alcohol use disorder.  She vapes for the past 4 years.  She smokes marijuana once weekly.  No  other drug use.  No NSAID use.  She typically passes normal formed brown stools most days, ever, she reported passing 2 loose black stools twice daily for 3 days prior to her hospital admission.  No history of ulcers or prior hospital admission for alcohol associated illness.  She denies having any current nausea or vomiting.  She has mild right mid abdominal pain which she stated started shortly after she was admitted.  She is an Designer, multimedia at [employer].  She was recently treated for UTI, chlamydia and bacterial vaginosis on Doxycycline and Flagyl.  She is from [place], her parents live in [place].   Past Surgical History:  Procedure Laterality Date   TONSILLECTOMY      Prior to Admission medications   Medication Sig Start Date End Date Taking? Authorizing Provider  medroxyPROGESTERone (DEPO-PROVERA) 150 MG/ML injection Inject 150 mg into the muscle every 3 (three) months. 01/30/22  Yes [provider]  doxycycline (VIBRA-TABS) 100 MG tablet Take 100 mg by mouth 2 (two) times daily. Patient not taking: Reported on 04/11/2022    [provider]  lamoTRIgine Starter Kit-Orange 42 x 25 MG & 7 x 100 MG KIT Take by mouth daily. Patient not taking: Reported on 04/11/2022 02/15/22   [provider]  metroNIDAZOLE (FLAGYL) 500 MG tablet Take 500 mg by mouth 2 (two) times daily. Patient not taking: Reported on 04/11/2022 04/06/22   [provider]  naltrexone (DEPADE) 50  MG tablet Take 50 mg by mouth daily. Patient not taking: Reported on 04/11/2022 02/15/22   [provider]  traZODone (DESYREL) 50 MG tablet Take 50-100 mg by mouth at bedtime as needed for sleep. Patient not taking: Reported on 04/11/2022 02/15/22   [provider]  VRAYLAR 1.5 MG capsule Take 1.5 mg by mouth daily. Patient not taking: Reported on 04/11/2022 02/15/22   [provider]    Current Facility-Administered Medications  Medication Dose Route Frequency  Provider Last Rate Last Admin   acetaminophen (TYLENOL) tablet 650 mg  650 mg Oral Q6H PRN Champ Mungo, DO       doxycycline (VIBRA-TABS) tablet 100 mg  100 mg Oral Q12H Champ Mungo, DO   100 mg at 04/12/22 2595   folic acid (FOLVITE) tablet 1 mg  1 mg Oral Daily Evlyn Kanner, MD   1 mg at 04/12/22 6387   HYDROmorphone (DILAUDID) tablet 1 mg  1 mg Oral Q6H PRN Champ Mungo, DO   1 mg at 04/12/22 0123   LORazepam (ATIVAN) tablet 1 mg  1 mg Oral Q6H Carroll Sage, PA-C   1 mg at 04/12/22 5643   Or   LORazepam (ATIVAN) tablet 0-4 mg  0-4 mg Oral Q6H Carroll Sage, PA-C   2 mg at 04/11/22 1606   [START ON 04/13/2022] LORazepam (ATIVAN) tablet 0-4 mg  0-4 mg Oral Q12H Carroll Sage, PA-C       metroNIDAZOLE (FLAGYL) tablet 500 mg  500 mg Oral Q12H Champ Mungo, DO   500 mg at 04/12/22 3295   nicotine (NICODERM CQ - dosed in mg/24 hours) patch 14 mg  14 mg Transdermal Daily Evlyn Kanner, MD   14 mg at 04/12/22 1884   nitrofurantoin (macrocrystal-monohydrate) (MACROBID) capsule 100 mg  100 mg Oral Q12H Champ Mungo, DO   100 mg at 04/12/22 0921   ondansetron (ZOFRAN) injection 4 mg  4 mg Intravenous Q6H PRN Evlyn Kanner, MD   4 mg at 04/12/22 1660   potassium PHOSPHATE 30 mmol in dextrose 5 % 500 mL infusion  30 mmol Intravenous Once Rocky Morel, DO 85 mL/hr at 04/12/22 0930 30 mmol at 04/12/22 0930   rivaroxaban (XARELTO) tablet 10 mg  10 mg Oral Daily Evlyn Kanner, MD   10 mg at 04/12/22 6301   thiamine (VITAMIN B1) tablet 100 mg  100 mg Oral Daily Carroll Sage, PA-C   100 mg at 04/12/22 6010   Or   thiamine (VITAMIN B1) injection 100 mg  100 mg Intravenous Daily Carroll Sage, PA-C        Allergies as of 04/10/2022   (No Known Allergies)    Family History  Problem Relation Age of Onset   Prostate cancer Paternal Grandfather     Social History   Socioeconomic History   Marital status: Single    Spouse name: Not on file   Number of  children: Not on file   Years of education: Not on file   Highest education level: Not on file  Occupational History   Not on file  Tobacco Use   Smoking status: Never   Smokeless tobacco: Never  Vaping Use   Vaping Use: Never used  Substance and Sexual Activity   Alcohol use: Never   Drug use: Yes    Types: Marijuana   Sexual activity: Not on file  Other Topics Concern   Not on file  Social History Narrative   Not on file  Social Determinants of Health   Financial Resource Strain: Not on file  Food Insecurity: No Food Insecurity (04/11/2022)   Hunger Vital Sign    Worried About Running Out of Food in the Last Year: Never true    Ran Out of Food in the Last Year: Never true  Transportation Needs: Unmet Transportation Needs (04/11/2022)   PRAPARE - Hydrologist (Medical): Yes    Lack of Transportation (Non-Medical): Yes  Physical Activity: Not on file  Stress: Not on file  Social Connections: Not on file  Intimate Partner Violence: Not At Risk (04/11/2022)   Humiliation, Afraid, Rape, and Kick questionnaire    Fear of Current or Ex-Partner: No    Emotionally Abused: No    Physically Abused: No    Sexually Abused: No    Review of Systems: Gen: Denies fever, sweats or chills. No weight loss.  CV: Denies chest pain, palpitations or edema. Resp: Denies cough, shortness of breath of hemoptysis.  GI:See HPI.  No heartburn or dysphagia. GU : Denies urinary burning, blood in urine, increased urinary frequency or incontinence. MS: Denies joint pain, muscles aches or weakness. Derm: Denies rash, itchiness, skin lesions or unhealing ulcers. Psych: + Anxiety and depression.  Heme: Denies easy bruising, bleeding. Neuro:  Denies headaches, dizziness or paresthesias. Endo:  Denies any problems with DM, thyroid or adrenal function.  Physical Exam: Vital signs in last 24 hours: Temp:  [98.4 F (36.9 C)-98.5 F (36.9 C)] 98.4 F (36.9 C) (01/25  0828) Pulse Rate:  [106-121] 121 (01/25 0828) Resp:  [16-18] 18 (01/25 0828) BP: (97-112)/(60-76) 110/76 (01/25 0828) SpO2:  [99 %-100 %] 99 % (01/25 0828) Last BM Date : 04/09/22 General: Alert 22 year old female in no acute distress. Head:  Normocephalic and atraumatic. Eyes:  No scleral icterus. Conjunctiva pink. Ears:  Normal auditory acuity. Nose:  No deformity, discharge or lesions. Mouth:  Dentition intact. No ulcers or lesions.  Neck:  Supple. No lymphadenopathy or thyromegaly.  Lungs: Breath sounds clear throughout. No wheezes, rhonchi or crackles.  Heart: Regular rate and rhythm, no murmurs. Abdomen: Soft, nondistended.  Mild tenderness to the right mid abdomen without rebound or guarding.  Positive bowel sounds to all 4 quadrants. Rectal: Deferred. Musculoskeletal:  Symmetrical without gross deformities.  Pulses:  Normal pulses noted. Extremities:  Without clubbing or edema. Neurologic:  Alert and  oriented x 4. No focal deficits.  Skin:  Intact without significant lesions or rashes. Psych:  Alert and cooperative. Normal mood and affect.  Intake/Output from previous day: 01/24 0701 - 01/25 0700 In: 360 [P.O.:360] Out: -  Intake/Output this shift: No intake/output data recorded.  Lab Results: Recent Labs    04/10/22 2046 04/12/22 0608  WBC 15.6* 7.7  HGB 13.6 12.4  HCT 43.3 37.9  PLT 435* 285   BMET Recent Labs    04/11/22 1602 04/11/22 2053 04/12/22 0615  NA 131* 131* 132*  K 3.7 3.8 3.3*  CL 100 99 102  CO2 18* 19* 18*  GLUCOSE 112* 70 76  BUN 6 6 6   CREATININE 0.88 0.79 0.92  CALCIUM 9.9 9.8 9.5   LFT Recent Labs    04/12/22 0615  PROT 6.9  ALBUMIN 3.7  AST 129*  ALT 77*  ALKPHOS 49  BILITOT 0.9   PT/INR No results for input(s): "LABPROT", "INR" in the last 72 hours. Hepatitis Panel No results for input(s): "HEPBSAG", "HCVAB", "HEPAIGM", "HEPBIGM" in the last 72 hours.    Studies/Results:  CT ABDOMEN PELVIS W CONTRAST  Result  Date: 04/11/2022 CLINICAL DATA:  Epigastric pain. EXAM: CT ABDOMEN AND PELVIS WITH CONTRAST TECHNIQUE: Multidetector CT imaging of the abdomen and pelvis was performed using the standard protocol following bolus administration of intravenous contrast. RADIATION DOSE REDUCTION: This exam was performed according to the departmental dose-optimization program which includes automated exposure control, adjustment of the mA and/or kV according to patient size and/or use of iterative reconstruction technique. CONTRAST:  46mL OMNIPAQUE IOHEXOL 350 MG/ML SOLN COMPARISON:  CT with IV contrast 05/27/2019. FINDINGS: Lower chest: No abnormality. Hepatobiliary: Enlarged, measuring 19 cm length with increased diffuse moderate to severe steatosis. Correlate clinically for steatohepatitis. There is no mass enhancement. The gallbladder and bile ducts are unremarkable. Pancreas: No abnormality. Spleen: No abnormality.  No splenomegaly. Adrenals/Urinary Tract: There is no adrenal or renal mass. There is contrast in the collecting systems which could obscure stones. No ureteral stones or hydronephrosis are seen. The bladder thickness is normal. Stomach/Bowel: The stomach is moderately fluid distended and there is a slightly dilated descending duodenum. There is a shortened distance between the abdominal aorta and SMA at the level of the third segment of duodenum, measuring only 5 mm and may predispose to SMA compression syndrome and impaired gastric emptying. No outlet obstructing mass is seen. Remainder of the small bowel is normal caliber. An appendix is not seen in this patient and could be surgically absent or obscured by overlapping structures. There is wall thickening versus nondistention in the ascending, transverse and descending colon. There are sigmoid diverticula without appreciable acute diverticulitis. Vascular/Lymphatic: No significant vascular findings are present. No enlarged abdominal or pelvic lymph nodes.  Reproductive: Uterus and bilateral adnexa are unremarkable. Other: Multiple pelvic phleboliths on the left. There is no free air, free fluid, free hemorrhage, abscess or incarcerated hernia. Musculoskeletal: No acute or significant osseous findings. IMPRESSION: 1. Moderately fluid distended stomach with slightly dilated descending duodenum. There is a shortened distance between the abdominal aorta and SMA of only 5 mm and may predispose to SMA duodenal compression syndrome and impaired gastric emptying. No outlet obstructing mass is seen. 2. Colitis versus nondistention of the ascending, transverse and descending colon. 3. Enlarged and moderately to severely steatotic liver. Correlate clinically for steatohepatitis. 4. Sigmoid diverticulosis without evidence of diverticulitis. Electronically Signed   By: Almira Bar M.D.   On: 04/11/2022 04:13   DG Chest 2 View  Result Date: 04/10/2022 CLINICAL DATA:  Chest pain. EXAM: CHEST - 2 VIEW COMPARISON:  None Available. FINDINGS: The heart size and mediastinal contours are within normal limits. Both lungs are clear. The visualized skeletal structures are unremarkable. IMPRESSION: No active cardiopulmonary disease. Electronically Signed   By: Aram Candela M.D.   On: 04/10/2022 21:23    IMPRESSION/PLAN:  6) 22 year old female with alcohol use disorder with N/V, right mid abdominal pain associated with heavy alcohol use and melenic stools x 3 days prior to admission. CTAP showed a moderately fluid-filled distended stomach with slightly dilated descending duodenum, colitis versus nondistention of the ascending, transverse and descending colon, sigmoid diverticulosis without evidence of diverticulitis and an enlarged moderately to severe steatotic liver. CT findings likely due to acute alcohol gastroduodenitis. Hg 13.6 -> 12.4.  No BM or melenic stools since admission. -Pantoprazole 40 mg IV twice daily -Carafate 1 g p.o. before every meal and nightly -No  plans for endoscopic evaluation at this time -Diet as tolerated -Pain management per the hospitalist -Ondansetron 4 mg p.o. or IV every 6 hours -  Patient counseled no alcohol -Hepatic panel and CBC in a.m. -Recommend discontinuing Xarelto for DVT prophylaxis, recommend ambulation -Monitor the patient for alcohol withdrawal, CIWA protocol in process -Await  further recommendations per Dr. Lavon Paganini  2) Elevated LFTs, secondary to alcohol use disorder and hepatic steatosis  3) Anxiety/depression.  Reported being off antianxiety/antidepressants for the past 2 months.  Patient denies suicidal ideation. -Recommend psych evaluation as an inpatient with outpatient follow up  4) Recently diagnosed with STD and BV on Doxycycline and Flagyl p.o.     Arnaldo Natal  04/12/2022, 12:42PM   Attending physician's note  I have taken a history, reviewed the chart and examined the patient. I performed a substantive portion of this encounter, including complete performance of at least one of the key components, in conjunction with the APP. I agree with the APP's note, impression and recommendations.    22 year old female with history of anxiety, depression and excessive EtOH use admitted with nausea, vomiting and abdominal pain  LFT pattern is consistent with alcohol induced liver injury AST >> ALT CT abdomen pelvis showed distention of stomach, duodenal wall and colon likely bowel wall edema in the setting of poor nutrition and hypoalbuminemia No significant drop in hemoglobin  Discussed alcohol cessation and to abstain from marijuana use Pantoprazole 40 mg twice daily Carafate before meals and at bedtime Regular diet as tolerated, may benefit from nutrition consult  Monitor for alcohol withdrawal  Conservative management, will defer endoscopic evaluation at this point   The patient was provided an opportunity to ask questions and all were answered. The patient agreed with the plan and  demonstrated an understanding of the instructions.  Iona Beard , MD [phone]

## 2022-04-12 NOTE — Progress Notes (Signed)
Subjective:  Patient reports she still has a headache and abdominal pain. She endorses tactile sensations on her ankles. She endorses nausea and anxiety. She has not tried to eat anything today. She denies vomiting.   She endorses dysuria and red vaginal discharge. Her next depo injection is in February.   Last night, she endorsed visual and auditory hallucinations. She saw shadows and heard people knocking at her door. These happened while her eyes were closed but she was awake. 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 Auditory hallucinations mostly occur when she is in an unfamiliar setting.  Objective:  Vital signs in last 24 hours: Vitals:   04/11/22 1735 04/11/22 2136 04/12/22 0338 04/12/22 0828  BP: 112/70 111/75 97/60 110/76  Pulse: (!) 107 (!) 111 (!) 106 (!) 121  Resp: 16  18 18   Temp: 98.4 F (36.9 C)  98.5 F (36.9 C) 98.4 F (36.9 C)  TempSrc:   Oral Oral  SpO2: 99%  100% 99%  Weight:      Height:       Weight change:   Intake/Output Summary (Last 24 hours) at 04/12/2022 1050 Last data filed at 04/12/2022 0400 Gross per 24 hour  Intake 360 ml  Output --  Net 360 ml   Physical exam: General: Tired appearing, laying in bed, in no acute distress Pulm: Normal work of breathing Cardio: Tachycardia. Normal rhythm. No murmurs, rubs, gallops Abd: Normal bowel sounds. Non-distended. Tender to palpation at RLQ and LLQ Psych: Positive for visual and auditory hallucinations. Normal speech and behavior.   Assessment/Plan: Darlene King is a 22 y.o. with pmhx of alcohol use disorder, anxiety, MDD, and bipolar disorder who presents with abdominal pain, nausea, and vomiting after increased alcohol intake and was admitted for alcohol ketoacidosis.  #Alcohol ketoacidosis- improving #Alcohol use disorder Patient endorses anxiety, tactile hallucinations, abdominal pain, and nausea today. Overall her symptoms have improved. She  remains tachycardic. CIWA scores are stable at 2 overnight x2, on scheduled ativan and has not required PRNs overnight. Potassium 3.3, phosphorus 1.2, and magnesium 2.0. K and phosph repleted today. Her anion gap is 12, and has closed. She still has a metabolic acidosis with bicarb 18. BG have remained normal. She has minor symptoms of withdrawal and will continue CIWA protocol with ativan. Patient was started on naltrexone in the past, can consider starting naltrexone at discharge if patient is amenable. 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 Surgery has been consulted, recommending GI work-up. Patient reports 3x melenic stools prior to admission. Last melenic stool on Sunday 1/21, this was also her most recent BM. She has not had a BM this admission, normal is 2-3x/day. She denies use of NSAIDs and Goody's Powder. There is a low concern for acute GI bleed given stable H/H. Low concern for GI obstruction given no vomiting. Constipation is likely in the setting of decreased PO intake. Peptic ulcer disease is likely given alcohol use. Per GI, will start PPI and carafate for alcohol gastroduodenitis. Will discontinue xarelto due to melena. Will continue to monitor stools and labs. -GI following, appreciate recommendations -Start IV pantoprazole 40mg  and carafate 1g before  meals and nightly -Bowel regimen -CBC tomorrow -Encourage PO intake  #Hepatosteatosis #LFT elevation On admission AST/ALT 139/69. LFTs remain stable today at 129/77. This is likely due to alcohol-induced liver injury given AST/ALT >1.5.  Continue to monitor. -CMP tomorrow  #UTI #Chlamydia #BV Patient was recently seen on 03/28/22 for UA showed small leukocytes, trace blood, negative nitrites. Urine culture showed mixed urogenital flora <10K colonies. Per student health, patient is also positive for BV and chlamydia. She has not been taking these medications. Will continue treatment given her symptoms. Patient amenable to further STI testing. RPR negative. Her contraception is Depo-Provera injections, next dose in February. Given concern for antibiotics affecting pharmacokinetics of contraception, will advise patient to use second form of contraception at discharge. -Continue doxycycline 100mg  x7 days (day 2), metronidazole 500mg  x7 days (day 2), nitrofurantoin 100mg  x5 days (day 2) -Pending GC/Chlamydia -Pending urine pregnancy  #Anxiety #MDD #Bipolar disorder Patient has a psychiatrist in [place] but none in [place]. She was diagnosed with bipolar disorder last year. She was prescribed naltrexone, lamotrigine, vraylar, and quetiapine by her psychiatrist. She stopped taking medications due to alcohol use. She never tried naltrexone. Patient endorses visual and auditory hallucinations of shadows and knocking at her door. This is a chronic problem since childhood and has been worse in the last year. Given the hallucinations occur mostly at night, could also be benign hypnagogic and hypnopompic hallucinations. Given her recent bipolar disorder diagnosis and active visual and auditory hallucinations, will consult psychiatry for further management. -Psychiatry consulted, appreciate recommendations  Diet: regular IVFs: none PPX: ambulation   LOS: 0 days   Leonette Nutting, Medical Student 04/12/2022, 10:50 AM  Attestation for Student Documentation:  I personally was present and performed or re-performed the history, physical exam and medical decision-making activities of this service and have verified that the service and findings are  accurately documented in the student's note.  Johny Blamer, DO 04/12/2022, 3:11 PM

## 2022-04-12 NOTE — Plan of Care (Signed)

## 2022-04-13 ENCOUNTER — Other Ambulatory Visit: Payer: Self-pay

## 2022-04-13 ENCOUNTER — Encounter (HOSPITAL_COMMUNITY): Payer: Self-pay | Admitting: Infectious Diseases

## 2022-04-13 ENCOUNTER — Encounter (HOSPITAL_COMMUNITY): Payer: Self-pay | Admitting: Family

## 2022-04-13 ENCOUNTER — Inpatient Hospital Stay (HOSPITAL_COMMUNITY)
Admission: AD | Admit: 2022-04-13 | Discharge: 2022-04-19 | DRG: 885 | Disposition: A | Discharge status: Home / Self Care | Payer: 59 | Source: Intra-hospital | Attending: Psychiatry | Admitting: Psychiatry

## 2022-04-13 DIAGNOSIS — F17210 Nicotine dependence, cigarettes, uncomplicated: Secondary | ICD-10-CM | POA: Diagnosis present

## 2022-04-13 DIAGNOSIS — F1090 Alcohol use, unspecified, uncomplicated: Secondary | ICD-10-CM | POA: Diagnosis present

## 2022-04-13 DIAGNOSIS — F333 Major depressive disorder, recurrent, severe with psychotic symptoms: Secondary | ICD-10-CM | POA: Diagnosis present

## 2022-04-13 DIAGNOSIS — R1013 Epigastric pain: Secondary | ICD-10-CM | POA: Diagnosis not present

## 2022-04-13 DIAGNOSIS — F1220 Cannabis dependence, uncomplicated: Secondary | ICD-10-CM | POA: Diagnosis present

## 2022-04-13 DIAGNOSIS — F419 Anxiety disorder, unspecified: Secondary | ICD-10-CM

## 2022-04-13 DIAGNOSIS — F3164 Bipolar disorder, current episode mixed, severe, with psychotic features: Secondary | ICD-10-CM | POA: Diagnosis not present

## 2022-04-13 DIAGNOSIS — Z7289 Other problems related to lifestyle: Secondary | ICD-10-CM

## 2022-04-13 DIAGNOSIS — F319 Bipolar disorder, unspecified: Secondary | ICD-10-CM | POA: Insufficient documentation

## 2022-04-13 DIAGNOSIS — E876 Hypokalemia: Secondary | ICD-10-CM | POA: Diagnosis present

## 2022-04-13 DIAGNOSIS — R7989 Other specified abnormal findings of blood chemistry: Secondary | ICD-10-CM

## 2022-04-13 DIAGNOSIS — G4700 Insomnia, unspecified: Secondary | ICD-10-CM | POA: Diagnosis present

## 2022-04-13 DIAGNOSIS — R443 Hallucinations, unspecified: Secondary | ICD-10-CM | POA: Diagnosis present

## 2022-04-13 DIAGNOSIS — Z789 Other specified health status: Secondary | ICD-10-CM

## 2022-04-13 DIAGNOSIS — F1010 Alcohol abuse, uncomplicated: Secondary | ICD-10-CM | POA: Diagnosis not present

## 2022-04-13 DIAGNOSIS — E8729 Other acidosis: Secondary | ICD-10-CM

## 2022-04-13 DIAGNOSIS — F1290 Cannabis use, unspecified, uncomplicated: Secondary | ICD-10-CM | POA: Diagnosis present

## 2022-04-13 DIAGNOSIS — F3130 Bipolar disorder, current episode depressed, mild or moderate severity, unspecified: Secondary | ICD-10-CM | POA: Diagnosis present

## 2022-04-13 DIAGNOSIS — Z79899 Other long term (current) drug therapy: Secondary | ICD-10-CM | POA: Diagnosis not present

## 2022-04-13 LAB — CBC
HCT: 39.7 % (ref 36.0–46.0)
Hemoglobin: 12.4 g/dL (ref 12.0–15.0)
MCH: 24.2 pg — ABNORMAL LOW (ref 26.0–34.0)
MCHC: 31.2 g/dL (ref 30.0–36.0)
MCV: 77.4 fL — ABNORMAL LOW (ref 80.0–100.0)
Platelets: 261 10*3/uL (ref 150–400)
RBC: 5.13 MIL/uL — ABNORMAL HIGH (ref 3.87–5.11)
RDW: 15 % (ref 11.5–15.5)
WBC: 4.1 10*3/uL (ref 4.0–10.5)
nRBC: 0 % (ref 0.0–0.2)

## 2022-04-13 LAB — URINALYSIS, COMPLETE (UACMP) WITH MICROSCOPIC
Bilirubin Urine: NEGATIVE
Glucose, UA: NEGATIVE mg/dL
Ketones, ur: 20 mg/dL — AB
Nitrite: NEGATIVE
Protein, ur: NEGATIVE mg/dL
Specific Gravity, Urine: 1.009 (ref 1.005–1.030)
pH: 6 (ref 5.0–8.0)

## 2022-04-13 LAB — COMPREHENSIVE METABOLIC PANEL
ALT: 131 U/L — ABNORMAL HIGH (ref 0–44)
AST: 220 U/L — ABNORMAL HIGH (ref 15–41)
Albumin: 3.6 g/dL (ref 3.5–5.0)
Alkaline Phosphatase: 48 U/L (ref 38–126)
Anion gap: 12 (ref 5–15)
BUN: 9 mg/dL (ref 6–20)
CO2: 23 mmol/L (ref 22–32)
Calcium: 9.7 mg/dL (ref 8.9–10.3)
Chloride: 100 mmol/L (ref 98–111)
Creatinine, Ser: 0.78 mg/dL (ref 0.44–1.00)
GFR, Estimated: >60 mL/min (ref >60)
Glucose, Bld: 82 mg/dL (ref 70–99)
Potassium: 3.1 mmol/L — ABNORMAL LOW (ref 3.5–5.1)
Sodium: 135 mmol/L (ref 135–145)
Total Bilirubin: 0.7 mg/dL (ref 0.3–1.2)
Total Protein: 6.6 g/dL (ref 6.5–8.1)

## 2022-04-13 LAB — RAPID URINE DRUG SCREEN, HOSP PERFORMED
Amphetamines: NOT DETECTED
Barbiturates: NOT DETECTED
Benzodiazepines: NOT DETECTED
Cocaine: NOT DETECTED
Opiates: POSITIVE — AB
Tetrahydrocannabinol: POSITIVE — AB

## 2022-04-13 LAB — SARS CORONAVIRUS 2 BY RT PCR: SARS Coronavirus 2 by RT PCR: NEGATIVE

## 2022-04-13 LAB — PHOSPHORUS: Phosphorus: 3.5 mg/dL (ref 2.5–4.6)

## 2022-04-13 LAB — MAGNESIUM: Magnesium: 2.1 mg/dL (ref 1.7–2.4)

## 2022-04-13 LAB — TSH: TSH: 1.851 u[IU]/mL (ref 0.350–4.500)

## 2022-04-13 LAB — FOLATE: Folate: 11.6 ng/mL (ref >5.9)

## 2022-04-13 LAB — VITAMIN B12: Vitamin B-12: 367 pg/mL (ref 180–914)

## 2022-04-13 MED ORDER — ONDANSETRON HCL 4 MG/2ML IJ SOLN: 4.0000 mg | Freq: Four times a day (QID) | INTRAMUSCULAR | Status: DC | PRN

## 2022-04-13 MED ORDER — METRONIDAZOLE 500 MG PO TABS
500.0000 mg | ORAL_TABLET | Freq: Two times a day (BID) | ORAL | Status: AC
  Administered 2022-04-14 – 2022-04-18 (×10): 500 mg via ORAL
  Filled 2022-04-13 (×11): qty 1

## 2022-04-13 MED ORDER — ARIPIPRAZOLE 2 MG PO TABS
2.0000 mg | ORAL_TABLET | Freq: Every day | ORAL | Status: DC
  Administered 2022-04-14 – 2022-04-15 (×2): 2 mg via ORAL
  Filled 2022-04-13 (×3): qty 1

## 2022-04-13 MED ORDER — ACETAMINOPHEN 325 MG PO TABS
650.0000 mg | ORAL_TABLET | Freq: Four times a day (QID) | ORAL | Status: DC | PRN
  Administered 2022-04-13: 650 mg via ORAL
  Filled 2022-04-13: qty 2

## 2022-04-13 MED ORDER — VITAMIN B-1 100 MG PO TABS
100.0000 mg | ORAL_TABLET | Freq: Every day | ORAL | Status: DC
  Administered 2022-04-14 – 2022-04-19 (×6): 100 mg via ORAL
  Filled 2022-04-13 (×7): qty 1

## 2022-04-13 MED ORDER — THIAMINE HCL 100 MG/ML IJ SOLN: 100.0000 mg | Freq: Every day | INTRAMUSCULAR | Status: DC

## 2022-04-13 MED ORDER — MAGNESIUM HYDROXIDE 400 MG/5ML PO SUSP: 30.0000 mL | Freq: Every day | ORAL | Status: DC | PRN

## 2022-04-13 MED ORDER — ADULT MULTIVITAMIN W/MINERALS CH
1.0000 | ORAL_TABLET | Freq: Every day | ORAL | Status: DC
  Administered 2022-04-14 – 2022-04-19 (×6): 1 via ORAL
  Filled 2022-04-13 (×7): qty 1

## 2022-04-13 MED ORDER — ALUM & MAG HYDROXIDE-SIMETH 200-200-20 MG/5ML PO SUSP: 30.0000 mL | ORAL | Status: DC | PRN

## 2022-04-13 MED ORDER — POTASSIUM CHLORIDE CRYS ER 20 MEQ PO TBCR
40.0000 meq | EXTENDED_RELEASE_TABLET | ORAL | Status: AC
  Administered 2022-04-13 (×2): 40 meq via ORAL
  Filled 2022-04-13 (×2): qty 2

## 2022-04-13 MED ORDER — ARIPIPRAZOLE 2 MG PO TABS
2.0000 mg | ORAL_TABLET | Freq: Every day | ORAL | Status: DC
  Filled 2022-04-13: qty 1

## 2022-04-13 MED ORDER — POTASSIUM CHLORIDE CRYS ER 20 MEQ PO TBCR
40.0000 meq | EXTENDED_RELEASE_TABLET | Freq: Two times a day (BID) | ORAL | Status: DC
  Administered 2022-04-13: 40 meq via ORAL
  Filled 2022-04-13: qty 2

## 2022-04-13 MED ORDER — LORAZEPAM 1 MG PO TABS
1.0000 mg | ORAL_TABLET | ORAL | Status: AC | PRN
  Administered 2022-04-14: 1 mg via ORAL
  Filled 2022-04-13: qty 1

## 2022-04-13 MED ORDER — POTASSIUM CHLORIDE 10 MEQ/100ML IV SOLN
10.0000 meq | INTRAVENOUS | Status: DC
  Administered 2022-04-13: 10 meq via INTRAVENOUS
  Filled 2022-04-13: qty 100

## 2022-04-13 MED ORDER — SUCRALFATE 1 G PO TABS
1.0000 g | ORAL_TABLET | Freq: Three times a day (TID) | ORAL | Status: DC
  Administered 2022-04-13 – 2022-04-19 (×21): 1 g via ORAL
  Filled 2022-04-13 (×30): qty 1

## 2022-04-13 MED ORDER — POTASSIUM PHOSPHATES 15 MMOLE/5ML IV SOLN: 30.0000 mmol | Freq: Once | INTRAVENOUS | Status: DC

## 2022-04-13 MED ORDER — POLYETHYLENE GLYCOL 3350 17 G PO PACK
17.0000 g | PACK | Freq: Every day | ORAL | Status: DC
  Administered 2022-04-14: 17 g via ORAL
  Filled 2022-04-13 (×7): qty 1

## 2022-04-13 MED ORDER — SENNOSIDES-DOCUSATE SODIUM 8.6-50 MG PO TABS
1.0000 | ORAL_TABLET | Freq: Every day | ORAL | Status: AC
  Administered 2022-04-13 – 2022-04-14 (×2): 1 via ORAL
  Filled 2022-04-13 (×3): qty 1

## 2022-04-13 MED ORDER — DOXYCYCLINE HYCLATE 100 MG PO TABS
100.0000 mg | ORAL_TABLET | Freq: Two times a day (BID) | ORAL | Status: AC
  Administered 2022-04-13 – 2022-04-18 (×10): 100 mg via ORAL
  Filled 2022-04-13 (×11): qty 1

## 2022-04-13 MED ORDER — NITROFURANTOIN MONOHYD MACRO 100 MG PO CAPS
100.0000 mg | ORAL_CAPSULE | Freq: Two times a day (BID) | ORAL | Status: AC
  Administered 2022-04-13 – 2022-04-16 (×6): 100 mg via ORAL
  Filled 2022-04-13 (×7): qty 1

## 2022-04-13 MED ORDER — NICOTINE 14 MG/24HR TD PT24
14.0000 mg | MEDICATED_PATCH | Freq: Every day | TRANSDERMAL | Status: DC
  Administered 2022-04-14: 14 mg via TRANSDERMAL
  Filled 2022-04-13 (×3): qty 1

## 2022-04-13 MED ORDER — FOLIC ACID 1 MG PO TABS
1.0000 mg | ORAL_TABLET | Freq: Every day | ORAL | Status: DC
  Administered 2022-04-14 – 2022-04-19 (×6): 1 mg via ORAL
  Filled 2022-04-13 (×7): qty 1

## 2022-04-13 NOTE — Progress Notes (Signed)
Pt was accepted to [facility] 04/13/22; Bed Assignment  302-2  Pt meets inpatient criteria per Sheran Fava, FNP   Attending Physician will be Dr. Caswell Corwin   Report can be called to: Adult unit: [phone]   Pt can arrive: BED IS READY   Care Team notified: [facility] Scharlene Gloss, RN, Sheran Fava, FNP, Estill Bamberg, RN, Juventino Slovak, RN, Wandra Feinstein, LCSW, [place], [place], [place] 04/13/2022 @ 6:29 PM

## 2022-04-13 NOTE — Progress Notes (Signed)
Pt is under review at [facility] for TODAY 04/13/22 PENDING COVID PCR, and IVC paperwork faxed to [phone]  Pt meets inpatient criteria per Sheran Fava, FNP  Attending Physician will be Dr. Caswell Corwin  Report can be called to: Adult unit: [phone]  Pt can arrive after PENDING items  Care Team notified: [facility] Scharlene Gloss, RN, Sheran Fava, FNP, Estill Bamberg, RN, Juventino Slovak, RN, Wandra Feinstein, LCSW, [place], [place], [place] 04/13/2022 @ 6:05 PM

## 2022-04-13 NOTE — Progress Notes (Signed)
[facility] ROUNDING NOTE   Subjective: Feeling better. No abd pain or vomiting. Mom at bedside   Objective: Vital signs in last 24 hours: Temp:  [97.9 F (36.6 C)-99.3 F (37.4 C)] 98.3 F (36.8 C) (01/26 1306) Pulse Rate:  [89-123] 123 (01/26 1306) Resp:  [18] 18 (01/26 1306) BP: (100-122)/(68-90) 122/81 (01/26 1306) SpO2:  [99 %-100 %] 100 % (01/26 1306) Last BM Date : 04/09/22 General: NAD Abd soft NTND    Intake/Output from previous day: No intake/output data recorded. Intake/Output this shift: No intake/output data recorded.   Lab Results: Recent Labs    04/10/22 2046 04/12/22 0608 04/13/22 0627  WBC 15.6* 7.7 4.1  HGB 13.6 12.4 12.4  PLT 435* 285 261  MCV 81.2 76.0* 77.4*   BMET Recent Labs    04/11/22 2053 04/12/22 0615 04/13/22 0627  NA 131* 132* 135  K 3.8 3.3* 3.1*  CL 99 102 100  CO2 19* 18* 23  GLUCOSE 70 76 82  BUN 6 6 9   CREATININE 0.79 0.92 0.78  CALCIUM 9.8 9.5 9.7   LFT Recent Labs    04/10/22 2046 04/12/22 0615 04/13/22 0627  PROT 8.1 6.9 6.6  ALBUMIN 4.4 3.7 3.6  AST 139* 129* 220*  ALT 69* 77* 131*  ALKPHOS 72 49 48  BILITOT 0.6 0.9 0.7   PT/INR No results for input(s): "INR" in the last 72 hours.    Imaging/Other results: No results found.    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  May take 4 to 6 weeks to trend back to normal  CT abdomen pelvis showed distention of stomach, duodenal wall and colon likely bowel wall edema in the setting of poor nutrition and hypoalbuminemia  Hemoglobin remains stable with no evidence of active GI bleed Use pantoprazole 40 mg daily for 8 weeks Carafate before meals and at bedtime for 2 weeks Discussed alcohol cessation and  to abstain from marijuana use  Regular diet   Monitor for alcohol withdrawal  GI will sign off, available if have any questions    K. Denzil Magnuson , MD [phone]  [facility]

## 2022-04-13 NOTE — Progress Notes (Signed)
   04/13/22 2303  Psychosocial Assessment  Patient Complaints Agitation;Crying spells;Panic attack;Worrying;Hopelessness;Anxiety;Sadness;Irritability;Loneliness  Eye Contact Fair  Facial Expression Anxious  Affect Anxious  Speech Logical/coherent  Interaction Cautious  Motor Activity Restless  Appearance/Hygiene Unremarkable  Behavior Characteristics Appropriate to situation  Mood Anxious  Thought Process  Coherency WDL  Content WDL  Perception WDL  Hallucination None reported or observed  Judgment Impaired  Confusion WDL  Danger to Self  Current suicidal ideation? Denies  Danger to Others  Danger to Others None reported or observed   Asleep. Will continue to monitor

## 2022-04-13 NOTE — Plan of Care (Signed)
[employer] was called for transport. They stated that they would arrive as soon as possible.

## 2022-04-13 NOTE — Progress Notes (Signed)
Patient ID: Darlene King, female   DOB: Jun 28, 2000, 22 y.o.   MRN: [id number]  Alert oriented x4. Pleasant/cooperative.  Transferred from [facility] to [facility] rm [id number]. Involuntary.Denies SI/HI/AVH. Patient reports depression, hopelessness, anxiety, use of THC. Initial assessment completed. Belongings secured in locker. 15 min checks completed. Patient verbally contracted for safety. Will continue to monitor.

## 2022-04-13 NOTE — Tx Team (Signed)
Initial Treatment Plan 04/13/2022 11:34 PM Darlene King KPT:[id number]    PATIENT STRESSORS: Traumatic event     PATIENT STRENGTHS: Capable of independent living  Motivation for treatment/growth    PATIENT IDENTIFIED PROBLEMS: Depression  Anxiety  Hopelessness  Panic attacks  Agitation             DISCHARGE CRITERIA:  Ability to meet basic life and health needs Improved stabilization in mood, thinking, and/or behavior  PRELIMINARY DISCHARGE PLAN: Return to previous living arrangement  PATIENT/FAMILY INVOLVEMENT: This treatment plan has been presented to and reviewed with the patient, Darlene King, and/or family member,  .  The patient and family have been given the opportunity to ask questions and make suggestions.  Irving Shows, RN 04/13/2022, 11:34 PM
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
## 2022-04-13 NOTE — Progress Notes (Signed)
Patient is medically stable for transfer to inpatient psychiatric care at this time.

## 2022-04-13 NOTE — Progress Notes (Addendum)
IVC complete, notarized, and faxed to [facility]. Await return fax with IVC order from magistrate.   UPDATE 1630: IVC order received and call placed requesting pt be served. [facility] is reviewing for admission.   Wandra Feinstein, MSW, LCSW [phone] (coverage)

## 2022-04-13 NOTE — Progress Notes (Signed)
Report called to [facility]. Now awaiting transfer

## 2022-04-13 NOTE — Plan of Care (Signed)
  Problem: Safety: Goal: Periods of time without injury will increase Outcome: Progressing   

## 2022-04-13 NOTE — Plan of Care (Signed)
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
## 2022-04-14 ENCOUNTER — Other Ambulatory Visit: Payer: Self-pay

## 2022-04-14 DIAGNOSIS — F333 Major depressive disorder, recurrent, severe with psychotic symptoms: Secondary | ICD-10-CM | POA: Diagnosis not present

## 2022-04-14 LAB — COMPREHENSIVE METABOLIC PANEL
ALT: 247 U/L — ABNORMAL HIGH (ref 0–44)
AST: 347 U/L — ABNORMAL HIGH (ref 15–41)
Albumin: 4.3 g/dL (ref 3.5–5.0)
Alkaline Phosphatase: 53 U/L (ref 38–126)
Anion gap: 9 (ref 5–15)
BUN: 9 mg/dL (ref 6–20)
CO2: 23 mmol/L (ref 22–32)
Calcium: 10.1 mg/dL (ref 8.9–10.3)
Chloride: 103 mmol/L (ref 98–111)
Creatinine, Ser: 0.71 mg/dL (ref 0.44–1.00)
GFR, Estimated: >60 mL/min (ref >60)
Glucose, Bld: 93 mg/dL (ref 70–99)
Potassium: 4.8 mmol/L (ref 3.5–5.1)
Sodium: 135 mmol/L (ref 135–145)
Total Bilirubin: 0.5 mg/dL (ref 0.3–1.2)
Total Protein: 8 g/dL (ref 6.5–8.1)

## 2022-04-14 LAB — CBC
HCT: 44.6 % (ref 36.0–46.0)
Hemoglobin: 14 g/dL (ref 12.0–15.0)
MCH: 24.4 pg — ABNORMAL LOW (ref 26.0–34.0)
MCHC: 31.4 g/dL (ref 30.0–36.0)
MCV: 77.8 fL — ABNORMAL LOW (ref 80.0–100.0)
Platelets: 312 10*3/uL (ref 150–400)
RBC: 5.73 MIL/uL — ABNORMAL HIGH (ref 3.87–5.11)
RDW: 15.2 % (ref 11.5–15.5)
WBC: 5.4 10*3/uL (ref 4.0–10.5)
nRBC: 0 % (ref 0.0–0.2)

## 2022-04-14 MED ORDER — ONDANSETRON 4 MG PO TBDP
ORAL_TABLET | ORAL | Status: AC
  Filled 2022-04-14: qty 1

## 2022-04-14 MED ORDER — ONDANSETRON 4 MG PO TBDP
4.0000 mg | ORAL_TABLET | Freq: Three times a day (TID) | ORAL | Status: DC | PRN
  Administered 2022-04-14 – 2022-04-15 (×2): 4 mg via ORAL
  Filled 2022-04-14: qty 1

## 2022-04-14 MED ORDER — IBUPROFEN 400 MG PO TABS
400.0000 mg | ORAL_TABLET | ORAL | Status: DC | PRN
  Administered 2022-04-14: 400 mg via ORAL
  Filled 2022-04-14: qty 1

## 2022-04-14 MED ORDER — LORAZEPAM 0.5 MG PO TABS
0.5000 mg | ORAL_TABLET | Freq: Two times a day (BID) | ORAL | Status: AC
  Administered 2022-04-16 – 2022-04-17 (×2): 0.5 mg via ORAL
  Filled 2022-04-14 (×2): qty 1

## 2022-04-14 MED ORDER — LORAZEPAM 1 MG PO TABS
1.0000 mg | ORAL_TABLET | Freq: Three times a day (TID) | ORAL | Status: AC
  Administered 2022-04-14 – 2022-04-15 (×3): 1 mg via ORAL
  Filled 2022-04-14 (×3): qty 1

## 2022-04-14 MED ORDER — LORAZEPAM 1 MG PO TABS
1.0000 mg | ORAL_TABLET | Freq: Two times a day (BID) | ORAL | Status: AC
  Administered 2022-04-15 – 2022-04-16 (×2): 1 mg via ORAL
  Filled 2022-04-14 (×2): qty 1

## 2022-04-14 NOTE — Progress Notes (Addendum)
  D. Pt has been visible in the milieu, observed attending groups. Pt had one episode of nausea/ vomiting  after receiving morning antibiotics- received prn zofran once, with no other episodes reported.  Pt received 1 mg prn ativan for CIWA score of 5 earlier in the shift.  Pt currently denies SI/HI and AVH  A. Labs and vitals monitored. Pt supported emotionally and encouraged to express concerns and ask questions.   R. Pt remains safe with 15 minute checks. Will continue POC.    04/14/22 1000  Psych Admission Type (Psych Patients Only)  Admission Status Involuntary  Psychosocial Assessment  Patient Complaints Anxiety  Eye Contact Fair  Facial Expression Anxious  Affect Appropriate to circumstance  Speech Logical/coherent  Interaction Cautious  Motor Activity Other (Comment) (steady gait)  Appearance/Hygiene Unremarkable  Behavior Characteristics Cooperative;Appropriate to situation  Mood Anxious  Thought Process  Coherency WDL  Content WDL  Delusions None reported or observed  Perception WDL  Hallucination None reported or observed  Judgment Impaired  Confusion WDL  Danger to Self  Current suicidal ideation? Denies  Danger to Others  Danger to Others None reported or observed

## 2022-04-14 NOTE — Group Note (Signed)
Date:  04/14/2022 Time:  6:28 PM  Group Topic/Focus:  Orientation:   The focus of this group is to educate the patient on the purpose and policies of crisis stabilization and provide a format to answer questions about their admission.  The group details unit policies and expectations of patients while admitted.    Participation Level:  Active  Participation Quality:  Appropriate  Affect:  Appropriate  Cognitive:  Appropriate  Insight: Appropriate  Engagement in Group:  Engaged  Modes of Intervention:  Discussion  Additional Comments:     Jerrye Beavers 04/14/2022, 6:28 PM
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
## 2022-04-14 NOTE — [facility] Group Notes (Signed)
Group Focus: affirmation, clarity of thought, and goals/reality orientation Treatment Modality:  Psychoeducation Interventions utilized were assignment, group exercise, and support Purpose: To be able to understand and verbalize the reason for their admission to the hospital. To understand that the medication helps with their chemical imbalance but they also need to work on their choices in life. To be challenged to develop a list of 30 positives about themselves. Also introduce the concept that "feelings" are not reality.  Participation Level:  Active  Participation Quality:  Appropriate  Affect:  Appropriate  Cognitive:  Appropriate  Insight:  Improving  Engagement in Group:  Engaged  Additional Comments:  Rates energy at a 7.5/10. Participated in the group.  Paulino Rily
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
## 2022-04-14 NOTE — Plan of Care (Signed)
  Problem: Education: Goal: Emotional status will improve Outcome: Progressing Goal: Mental status will improve Outcome: Progressing   Problem: Coping: Goal: Ability to verbalize frustrations and anger appropriately will improve Outcome: Progressing   Problem: Health Behavior/Discharge Planning: Goal: Compliance with treatment plan for underlying cause of condition will improve Outcome: Progressing   Problem: Coping: Goal: Coping ability will improve Outcome: Progressing

## 2022-04-14 NOTE — B[facility] Group Notes (Signed)
.  Psychoeducational Group Note    Date:  04/14/2022 Time:1300-1400    Purpose of Group: . The group focus' on teaching patients on how to identify their needs and their Life Skills:  A group where two lists are made. What people need and what are things that we do that are unhealthy. The lists are developed by the patients and it is explained that we often do the actions that are not healthy to get our list of needs met.  Goal:: to develop the coping skills needed to get their needs met  Participation Level:  did not attend Darlene King A     

## 2022-04-14 NOTE — Progress Notes (Signed)
[place] Group Notes:  (Nursing/MHT/Case Management/Adjunct)  Date:  04/14/2022  Time: 2000  Type of Therapy:   wrap up group  Participation Level:  Active  Participation Quality:  Appropriate, Attentive, Sharing, and Supportive  Affect:  Appropriate  Cognitive:  Alert  Insight:  Improving  Engagement in Group:  Engaged  Modes of Intervention:  Clarification, Education, and Support  Summary of Progress/Problems: Positive thinking and positive change were discussed.   Shellia Cleverly 04/14/2022, 8:35 PM

## 2022-04-14 NOTE — Group Note (Signed)
LCSW Group Therapy Note   No social work group was held; the following was provided to each patient  in lieu of in-person group:  Healthy vs. 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

## 2022-04-14 NOTE — B[facility] Suicide Risk Assessment (Signed)
[place] INPATIENT:  Family/Significant Other Suicide Prevention Education  Suicide Prevention Education:  Patient Refusal for Family/Significant Other Suicide Prevention Education: The patient Darlene King has refused to provide written consent for family/significant other to be provided Family/Significant Other Suicide Prevention Education during admission and/or prior to discharge.  Physician notified.  Berlin Hun Grossman-Orr 04/14/2022, 4:05 PM
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
## 2022-04-14 NOTE — Group Note (Signed)
Date:  04/14/2022 Time:  6:33 PM  Group Topic/Focus:  Orientation:   The focus of this group is to educate the patient on the purpose and policies of crisis stabilization and provide a format to answer questions about their admission.  The group details unit policies and expectations of patients while admitted.    Participation Level:  Minimal  Participation Quality:  Attentive  Affect:  Appropriate  Cognitive:  Appropriate  Insight: Appropriate  Engagement in Group:  Limited  Modes of Intervention:  Discussion  Additional Comments:     Jerrye Beavers 04/14/2022, 6:33 PM

## 2022-04-15 DIAGNOSIS — F333 Major depressive disorder, recurrent, severe with psychotic symptoms: Secondary | ICD-10-CM | POA: Diagnosis not present

## 2022-04-15 LAB — HEPATIC FUNCTION PANEL
ALT: 263 U/L — ABNORMAL HIGH (ref 0–44)
AST: 292 U/L — ABNORMAL HIGH (ref 15–41)
Albumin: 4.1 g/dL (ref 3.5–5.0)
Alkaline Phosphatase: 43 U/L (ref 38–126)
Bilirubin, Direct: 0.1 mg/dL (ref 0.0–0.2)
Indirect Bilirubin: 0.5 mg/dL (ref 0.3–0.9)
Total Bilirubin: 0.6 mg/dL (ref 0.3–1.2)
Total Protein: 7.4 g/dL (ref 6.5–8.1)

## 2022-04-15 MED ORDER — CARIPRAZINE HCL 1.5 MG PO CAPS
1.5000 mg | ORAL_CAPSULE | Freq: Every day | ORAL | Status: DC
  Administered 2022-04-15 – 2022-04-16 (×2): 1.5 mg via ORAL
  Filled 2022-04-15 (×5): qty 1

## 2022-04-15 MED ORDER — NICOTINE POLACRILEX 2 MG MT GUM
2.0000 mg | CHEWING_GUM | OROMUCOSAL | Status: DC | PRN
  Administered 2022-04-15 – 2022-04-19 (×15): 2 mg via ORAL
  Filled 2022-04-15 (×10): qty 1

## 2022-04-15 MED ORDER — HYDROXYZINE HCL 25 MG PO TABS
25.0000 mg | ORAL_TABLET | Freq: Three times a day (TID) | ORAL | Status: DC | PRN
  Administered 2022-04-15 – 2022-04-19 (×6): 25 mg via ORAL
  Filled 2022-04-15 (×6): qty 1

## 2022-04-15 MED ORDER — TRAZODONE HCL 50 MG PO TABS
50.0000 mg | ORAL_TABLET | Freq: Every evening | ORAL | Status: DC | PRN
  Administered 2022-04-15: 50 mg via ORAL
  Filled 2022-04-15: qty 1

## 2022-04-15 NOTE — Progress Notes (Addendum)
D. Pt presents with a bright affect- has been pleasant upon approach- denied withdrawal symptoms,  SI/HI and AVH, but reported that sometimes she hears a voice saying "hi". Per pt's self inventory, pt rated her depression,hopelessness and anxiety a 0/0/2, respectively. Pt has been visible in the milieu, observed attending group. Marland KitchenPt reported that her goal was "to talk with the social worker", and "work on a plan to start the process of contacting my school to connect with the [employer] program."  A. Labs and vitals monitored. Pt given and educated on medications. Pt supported emotionally and encouraged to express concerns and ask questions.   R. Pt remains safe with 15 minute checks. Will continue POC.    04/15/22 1000  Psych Admission Type (Psych Patients Only)  Admission Status Involuntary  Psychosocial Assessment  Patient Complaints Anxiety  Eye Contact Fair  Facial Expression Animated  Affect Appropriate to circumstance  Speech Logical/coherent  Interaction Assertive  Motor Activity Other (Comment) (steady gait)  Appearance/Hygiene Unremarkable  Behavior Characteristics Appropriate to situation;Cooperative  Mood Anxious;Pleasant  Thought Process  Coherency WDL  Content WDL  Delusions None reported or observed  Perception Hallucinations  Hallucination None reported or observed  Judgment Impaired  Confusion WDL  Danger to Self  Current suicidal ideation? Denies  Danger to Others  Danger to Others None reported or observed

## 2022-04-15 NOTE — Progress Notes (Signed)
   04/15/22 0610  15 Minute Checks  Location Bedroom  Visual Appearance Calm  Behavior Composed  Sleep (Behavioral Health Patients Only)  Calculate sleep? (Click Yes once per 24 hr at 0600 safety check) Yes  Documented sleep last 24 hours 6.25

## 2022-04-15 NOTE — BHH Group Notes (Signed)
Adult Psychoeducational Group Note Date:  04/15/2022 Time:  0900-1000 Group Topic/Focus: PROGRESSIVE RELAXATION. A group where deep breathing is taught and tensing and relaxation muscle groups is used. Imagery is used as well.  Pts are asked to imagine 3 pillars that hold them up when they are not able to hold themselves up and to share that with the group.   Participation Level:  Active  Participation Quality:  Appropriate  Affect:  Appropriate  Cognitive:  Approprate  Insight: Improving  Engagement in Group:  Engaged  Modes of Intervention:  deep breathing, Imagery. Discussion  Additional Comments:  Pt rates energy at a 9/10. Left the room.   : Paulino Rily

## 2022-04-15 NOTE — Progress Notes (Signed)
The focus of this group is to help patients review their daily goal of treatment and discuss progress on daily workbooks  Pt attended the evening group and responded to all discussion prompts from the [place]. Pt shared that today was a good day on the unit, the highlight of which was a surprise visit from her father, whom she considers supportive.  On the subject of goals for the coming week, Pt mentioned wanting to have her medications adjusted. Pt was encouraged by the Writer to provide all medication feedback to her RNs and Doctor.  Pt rated her day an 8 out of 10 and her affect was appropriate.

## 2022-04-15 NOTE — BHH Group Notes (Signed)
Adult Psychoeducational Group  Date:  04/15/2022 Time: 1300-1400  Group Topic/Focus: Continuation of the group from Saturday. Looking at the lists that were created and talking about what needs to be done with the homework of 30 positives about themselves.                                     Talking about taking their power back and helping themselves to develop King positive self esteem.      Participation Quality:  did not attend  Darlene King  

## 2022-04-15 NOTE — Progress Notes (Signed)
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 She reported drinking 2 pints of rum night before with persistent nausea and vomiting. She was treated medically for Alcohol Ketoacidosis, alcohol gastroduodenitis, hypokalemia, heaptosteatosis, LFT elevation with concerns for unspecified mood disorder. Patient was assessed by psychiatric service while in hospital and referred for inpatient hospitalization.    24 hour assessment: Patient remains visible in the milieu attending groups and interacting with peers and staff appropriately. Patient had no episode of nausea and vomiting since yesterday; denies any withdrawal symptoms with the exception of some anxiety she attributes to vape cravings. Continues to tolerate food and fluids. Continues on Doxycycline 100 mg q12hrs, Metronidazole 500 mg, Macrobid 100 mg q12 hrs; Lorazepam taper, CIWA. Vital signs resolving; 106/83, HR 102. Last CIWA Score - 4.    Labs: UDS+ opiates, THC; received Dilaudid in ED for pain. BAL 69. LFTs: AST- 347, Alt - 247. PLT - 312.  04/15/22: AST- 292, ALT - 263   Assessment: Patient observed in dayroom interacting with peers and staff. Presents alert and oriented. Casual appearance. Calm and cooperative. Attending to ADLs independently and properly. Reports 2-3 hours of sleep overnight. Nursing documented 6.25 hrs. States she woke  up a little drowsy; otherwise feels fine. Improved appetite. Consistent contact with family that she reports has been 'good'. She denies any alcohol withdrawal symptoms; feels current symptoms are related to possible vape withdrawals. Discussed restarting Vraylar at small dose with gradual increase to address underlying mood; she is agreeable to restart. Otherwise plan is to continue medication regimen as noted below with daily LFT labs. She rates anxiety and depression as 0/10. States depression is typically reflective of the environment she is in and feels current environment is 'pretty cool'. She denies any thoughts of wanting to harm herself or anyone. Verbalizes interest in participating in IOP treatment for substance abuse post-discharge while still in school.   Total Time spent with patient: 35 minutes  Recent medication trials:  Naltrexone 50 mg daily 02/15/22 Lamotrigine 25 mg x7 days, 100 mg 04/09/2022 Vraylar 1.5 mg daily 02/15/22; restarted 04/15/22 Seroquel pt reported history; none found in chart Trazodone 50 mg PRN insomnia 02/15/22 Abilify 2 mg daily started on admission 03/2022; stopped 04/15/2022  Past Psychiatric History: bipolar   Past Medical History: History reviewed. No pertinent past medical history.  Past Surgical History:  Procedure Laterality Date   TONSILLECTOMY     Family History:  Family History  Problem Relation Age of Onset   Prostate cancer Paternal Grandfather    Family Psychiatric History: substance abuse, bipolar (m) grandfather; bipolar (m) uncle, 1st cousin (his daughter). All have been hospitalized. Mother- anxiety, sister- BPD, anxiety, ADHD, OCD, bipolar   Social History:  Social History   Substance and Sexual Activity  Alcohol Use Yes     Social History   Substance and Sexual Activity  Drug Use Yes   Types: Marijuana    Social History   Socioeconomic History  Marital status: Single    Spouse name: Not on file   Number of children:  Not on file   Years of education: Not on file   Highest education level: Not on file  Occupational History   Not on file  Tobacco Use   Smoking status: Every Day   Smokeless tobacco: Never  Vaping Use   Vaping Use: Every day  Substance and Sexual Activity   Alcohol use: Yes   Drug use: Yes    Types: Marijuana   Sexual activity: Not on file  Other Topics Concern   Not on file  Social History Narrative   Not on file   Social Determinants of Health   Financial Resource Strain: Not on file  Food Insecurity: No Food Insecurity (04/13/2022)   Hunger Vital Sign    Worried About Running Out of Food in the Last Year: Never true    Ran Out of Food in the Last Year: Never true  Transportation Needs: No Transportation Needs (04/13/2022)   PRAPARE - Hydrologist (Medical): No    Lack of Transportation (Non-Medical): No  Recent Concern: Transportation Needs - Unmet Transportation Needs (04/11/2022)   PRAPARE - Hydrologist (Medical): Yes    Lack of Transportation (Non-Medical): Yes  Physical Activity: Not on file  Stress: Not on file  Social Connections: Not on file   Additional Social History:   Sleep: Fair  Appetite:   improving  Current Medications: Current Facility-Administered Medications  Medication Dose Route Frequency Provider Last Rate Last Admin   alum & mag hydroxide-simeth (MAALOX/MYLANTA) 200-200-20 MG/5ML suspension 30 mL  30 mL Oral Q4H PRN Starkes-Perry, Gayland Curry, FNP       ARIPiprazole (ABILIFY) tablet 2 mg  2 mg Oral Daily Suella Broad, FNP   2 mg at 04/15/22 0807   doxycycline (VIBRA-TABS) tablet 100 mg  100 mg Oral Q12H Suella Broad, FNP   100 mg at 95/63/87 5643   folic acid (FOLVITE) tablet 1 mg  1 mg Oral Daily Suella Broad, FNP   1 mg at 04/15/22 3295   ibuprofen (ADVIL) tablet 400 mg  400 mg Oral Q4H PRN Leevy-Blaney, Kristopher Delk A, NP   400 mg at 04/14/22 1822   LORazepam  (ATIVAN) tablet 1 mg  1 mg Oral TID Dian Situ, MD   1 mg at 04/15/22 1884   Followed by   LORazepam (ATIVAN) tablet 1 mg  1 mg Oral BID Winfred Leeds, Nadir, MD       Followed by   Derrill Memo ON 04/16/2022] LORazepam (ATIVAN) tablet 0.5 mg  0.5 mg Oral BID Winfred Leeds, Nadir, MD       LORazepam (ATIVAN) tablet 1-4 mg  1-4 mg Oral Q1H PRN Suella Broad, FNP   1 mg at 04/14/22 1127   magnesium hydroxide (MILK OF MAGNESIA) suspension 30 mL  30 mL Oral Daily PRN Suella Broad, FNP       metroNIDAZOLE (FLAGYL) tablet 500 mg  500 mg Oral Q12H Suella Broad, FNP   500 mg at 04/15/22 1660   multivitamin with minerals tablet 1 tablet  1 tablet Oral Daily Suella Broad, FNP   1 tablet at 04/15/22 6301   nicotine polacrilex (NICORETTE) gum 2 mg  2 mg Oral PRN Ajibola, Ene A, NP   2 mg at 04/15/22 0855   nitrofurantoin (macrocrystal-monohydrate) (MACROBID) capsule 100 mg  100 mg Oral Q12H Starkes-Perry, Gayland Curry, FNP  100 mg at 04/15/22 0807   ondansetron (ZOFRAN-ODT) disintegrating tablet 4 mg  4 mg Oral Q8H PRN Leevy-Tipler, Sahian Kerney A, NP   4 mg at 04/15/22 0744   polyethylene glycol (MIRALAX / GLYCOLAX) packet 17 g  17 g Oral Daily Suella Broad, FNP   17 g at 04/14/22 [id number]   sucralfate (CARAFATE) tablet 1 g  1 g Oral TID WC & HS Suella Broad, FNP   1 g at 04/15/22 [id number]   thiamine (Vitamin B-1) tablet 100 mg  100 mg Oral Daily Suella Broad, FNP   100 mg at 04/15/22 [id number]   Or   thiamine (VITAMIN B1) injection 100 mg  100 mg Intravenous Daily Suella Broad, FNP       Lab Results:  Results for orders placed or performed during the hospital encounter of 04/13/22 (from the past 48 hour(s))  CBC     Status: Abnormal   Collection Time: 04/14/22  6:34 AM  Result Value Ref Range   WBC 5.4 4.0 - 10.5 K/uL   RBC 5.73 (H) 3.87 - 5.11 MIL/uL   Hemoglobin 14.0 12.0 - 15.0 g/dL   HCT 44.6 36.0 - 46.0 %   MCV 77.8 (L) 80.0 - 100.0 fL   MCH 24.4 (L) 26.0  - 34.0 pg   MCHC 31.4 30.0 - 36.0 g/dL   RDW 15.2 11.5 - 15.5 %   Platelets 312 150 - 400 K/uL   nRBC 0.0 0.0 - 0.2 %    Comment: Performed at [facility], [place] [address], [place], [place] [address]  Comprehensive metabolic panel     Status: Abnormal   Collection Time: 04/14/22  6:34 AM  Result Value Ref Range   Sodium 135 135 - 145 mmol/L   Potassium 4.8 3.5 - 5.1 mmol/L   Chloride 103 98 - 111 mmol/L   CO2 23 22 - 32 mmol/L   Glucose, Bld 93 70 - 99 mg/dL    Comment: Glucose reference range applies only to samples taken after fasting for at least 8 hours.   BUN 9 6 - 20 mg/dL   Creatinine, Ser 0.71 0.44 - 1.00 mg/dL   Calcium 10.1 8.9 - 10.3 mg/dL   Total Protein 8.0 6.5 - 8.1 g/dL   Albumin 4.3 3.5 - 5.0 g/dL   AST 347 (H) 15 - 41 U/L   ALT 247 (H) 0 - 44 U/L   Alkaline Phosphatase 53 38 - 126 U/L   Total Bilirubin 0.5 0.3 - 1.2 mg/dL   GFR, Estimated >60 >60 mL/min    Comment: (NOTE) Calculated using the CKD-EPI Creatinine Equation (2021)    Anion gap 9 5 - 15    Comment: Performed at [facility], [place] [address], [place], [place] [address]  Hepatic function panel     Status: Abnormal   Collection Time: 04/15/22  6:30 AM  Result Value Ref Range   Total Protein 7.4 6.5 - 8.1 g/dL   Albumin 4.1 3.5 - 5.0 g/dL   AST 292 (H) 15 - 41 U/L   ALT 263 (H) 0 - 44 U/L   Alkaline Phosphatase 43 38 - 126 U/L   Total Bilirubin 0.6 0.3 - 1.2 mg/dL   Bilirubin, Direct 0.1 0.0 - 0.2 mg/dL   Indirect Bilirubin 0.5 0.3 - 0.9 mg/dL    Comment: Performed at [facility], [place] [address] [place], [place] [address]   Blood Alcohol level:  Lab Results  Component Value Date   ETH 69 (H) 04/10/2022   ETH <10 [id number]   Metabolic Disorder Labs: Lab Results  Component Value Date   HGBA1C 5.4 06/14/2020   MPG 108.28 06/14/2020   No results found for: "PROLACTIN" Lab Results  Component Value Date   CHOL 194 06/14/2020   TRIG  54 06/14/2020   HDL 80 06/14/2020   CHOLHDL 2.4 06/14/2020   VLDL 11 06/14/2020   LDLCALC 103 (H) 06/14/2020   Physical Findings: AIMS:  , ,  ,  ,    CIWA:  CIWA-Ar Total: 4 COWS:     Musculoskeletal: Strength & Muscle Tone: within normal limits Gait & Station: normal Patient leans: N/A  Psychiatric Specialty Exam:  Presentation  General Appearance:  Casual  Eye Contact: Good  Speech: Clear and Coherent  Speech Volume: Normal  Handedness: Right   Mood and Affect  Mood: Euthymic  Affect: Congruent; Appropriate   Thought Process  Thought Processes: Coherent  Descriptions of Associations:Intact  Orientation:Full (Time, Place and Person)  Thought Content:Logical  History of Schizophrenia/Schizoaffective disorder:No data recorded Duration of Psychotic Symptoms:No data recorded Hallucinations:Hallucinations: None  Ideas of Reference:None  Suicidal Thoughts:Suicidal Thoughts: No  Homicidal Thoughts:Homicidal Thoughts: No   Sensorium  Memory: Immediate Good; Recent Good  Judgment: Fair  Insight: Good   Executive Functions  Concentration: Good  Attention Span: Good  Recall: Good  Fund of Knowledge: Good  Language: Good   Psychomotor Activity  Psychomotor Activity: Psychomotor Activity: Normal   Assets  Assets: Communication Skills; Physical Health; Resilience; Financial Resources/Insurance; Housing; Social Support   Sleep  Sleep: Sleep: Poor (pt reports 2 hrs of sleep)    Physical Exam: Physical Exam Vitals and nursing note reviewed.  HENT:     Head: Normocephalic.     Nose: Nose normal.     Mouth/Throat:     Mouth: Mucous membranes are moist.     Pharynx: Oropharynx is clear.  Eyes:     Pupils: Pupils are equal, round, and reactive to light.  Cardiovascular:     Rate and Rhythm: Normal rate.     Pulses: Normal pulses.  Pulmonary:     Effort: Pulmonary effort is normal.  Abdominal:     Palpations:  Abdomen is soft.  Musculoskeletal:        General: Normal range of motion.     Cervical back: Normal range of motion.  Skin:    General: Skin is warm and dry.  Neurological:     Mental Status: She is alert and oriented to person, place, and time. Mental status is at baseline.  Psychiatric:        Attention and Perception: Attention and perception normal.        Mood and Affect: Mood and affect normal.        Speech: Speech normal.        Behavior: Behavior normal. Behavior is cooperative.        Thought Content: Thought content normal.        Cognition and Memory: Cognition and memory normal.        Judgment: Judgment normal.    Review of Systems  Psychiatric/Behavioral:  Positive for substance abuse.   All other systems reviewed and are negative.  Blood pressure 106/83, pulse (!) 102, temperature 97.6 F (36.4 C), temperature source Oral, resp. rate 16, height 5\' 4"  (1.626 m), weight 49 kg, SpO2 100 %. Body mass index is 18.54 kg/m.   Treatment Plan Summary: Daily contact with  patient to assess and evaluate symptoms and progress in treatment, Medication management, and Plan   PLAN:    Safety and Monitoring:             --  Voluntary admission to inpatient psychiatric unit for safety, stabilization and treatment             -- Daily contact with patient to assess and evaluate symptoms and progress in treatment             -- Patient's case to be discussed in multi-disciplinary team meeting             -- Observation Level : q15 minute checks             -- Vital signs:  q12 hours             -- Precautions: suicide, elopement, and assault   2. Psychiatric Diagnoses and Treatment:  Discontinue:              - Aripiprazole 2 mg daily for  Depression Restart:   - Vraylar 1.5 mg daily for bipolar  Continue:              - Doxycycline 100 mg q12 hrs for  Infection               - CIWA Protocol: Folic Acid 1 mg    daily, multivitamin 1 tab daily, Thiamine 100 mg daily (PO  or IM)   - Lorazepam Taper: Lorazepam 1  mg TID day #1, Lorazepam 1 mg  BID day#2, Lorazepam 0.5 mg BID day #3   - Metronidazole 500 mg q 12 hrs   - Nicoderm patch 14 mg daily   - Nitrofurantoin 100 mg q12 hrs    - Polyethylene glycol 17 g daily   - Senna-docusate 1 tab daily HS   - Sucralfate 1 g TID w/ meals & HS  PRNs:              - Maalox/Mylanta 30 mL q4hrs  PRN indigestion - Ibuprofen 400 mg q4hrs PRN  mild pain             - Lorazepam 1-4 mg q1hr PRN  withdrawal symptoms - Magnesium Hydroxide suspension 30 mL daily PRN  mild constipation - Ondansetron ODT 4 mg q8hrs  PRN nausea, vomiting   --  The risks/benefits/side-effects/alternatives to this medication were discussed in detail with the patient and time was given for questions. The patient consents to medication trial.                -- Metabolic profile and EKG  monitoring obtained while on an  atypical antipsychotic (BMI: Lipid Panel: HbgA1c: QTc:)                -- Encouraged patient to participate  in unit milieu and in scheduled group  therapies                      -- Plan to continue to monitor LFTs;  ordered for 04/15/22. Tylenol  discontinued; Ibuprofen 400 mg q4 hrs PRN ordered.     3. Medical Issues Being Addressed:              Tobacco Use Disorder             -- Nicotine gum as needed   4. Discharge Planning:              -- Social work  and case  management to assist with  discharge planning and identification of hospital follow-up needs prior to discharge             -- Estimated LOS: 5-7 days             -- Discharge Concerns: Need to  establish a safety plan; Medication  compliance and effectiveness             -- Discharge Goals: Return home  with outpatient referrals for mental  health follow-up including  medication  management/psychotherapy.   Observation Level/Precautions:  15 minute checks  Laboratory:  CBC Chemistry Profile HbAIC HCG UDS UA Vitamin B-12 LFTs  repeated 04/15/22  Psychotherapy:  group, milieu  Medications:  see MAR  Consultations:  social work, Nutrition services  Discharge Concerns:  safety  Estimated LOS: 5-7 days  Other:      Physician Treatment Plan for Primary Diagnosis: MDD (major depressive disorder), recurrent, severe, with psychosis ([place]) Long Term Goal(s): Improvement in symptoms so as ready for discharge   Short Term Goals: Ability to identify changes in lifestyle to reduce recurrence of condition will improve, Ability to verbalize feelings will improve, Ability to disclose and discuss suicidal ideas, Ability to demonstrate self-control will improve, Ability to identify and develop effective coping behaviors will improve, Ability to maintain clinical measurements within normal limits will improve, Compliance with prescribed medications will improve, and Ability to identify triggers associated with substance abuse/mental health issues will improve   Physician Treatment Plan for Secondary Diagnosis: Principal Problem:   MDD (major depressive disorder), recurrent, severe, with psychosis ([place])   Long Term Goal(s): Improvement in symptoms so as ready for discharge   Short Term Goals: Ability to identify changes in lifestyle to reduce recurrence of condition will improve, Ability to verbalize feelings will improve, Ability to disclose and discuss suicidal ideas, Ability to demonstrate self-control will improve, Ability to identify and develop effective coping behaviors will improve, Ability to maintain clinical measurements within normal limits will improve, Compliance with prescribed medications will improve, and Ability to identify triggers associated with substance abuse/mental health issues will improve   I certify that inpatient services furnished can reasonably be expected to improve the patient's condition.  Inda Merlin, NP 04/15/2022, 9:31 AM

## 2022-04-15 NOTE — Progress Notes (Signed)
D- Patient alert and oriented.  Denies SI, HI, VH, and pain. Endorses AH describing them as, "making their presence known" but not command in nature. Patient reports that she became "discouraged after dinner when I realized that I'm involuntary and what that really means." Patient asked questions regarding alcohol use and long term effects on the body. Patient reports that she was previously diagnosed with bipolar but had stopped taking her medications because she was afraid of the interaction with alcohol. Patient states that post discharge she would like to participate in IOP. Patient identifies her parents as her biggest support system.  A- Scheduled medications administered to patient, per MAR. Support and encouragement provided. Education on alcoholism and effects on the body provided. Medication education provided. Routine safety checks conducted every 15 minutes.  Patient informed to notify staff with problems or concerns.  R- No adverse drug reactions noted. Patient contracts for safety at this time. Patient compliant with medications and treatment plan. Patient verbalized understanding of education. Patient receptive, calm, and cooperative. Patient interacts well with others on the unit.  Patient remains safe at this time.

## 2022-04-15 NOTE — Group Note (Signed)
LCSW Group Therapy Note   Group Date: 04/15/2022 Start Time: 5974 End Time: 1145  Type of Therapy and Topic:  Group Therapy:  Communication  Participation Level:  Active   Description of Group:    In this group patients will be encouraged to explore how individuals communicate with one another appropriately and inappropriately. Patients will be guided to discuss their thoughts, feelings, and behaviors related to barriers communicating feelings, needs, and stressors. The group will process together ways to execute positive and appropriate communications, with attention given to how one use behavior, tone, and body language to communicate. Patient will be encouraged to reflect on an incident where they were successfully able to communicate and the factors that they believe helped them to communicate. Each patient will be encouraged to identify specific changes they are motivated to make in order to overcome communication barriers with self, peers, authority, and parents. This group will be process-oriented, with patients participating in exploration of their own experiences as well as giving and receiving support and challenging self as well as other group members.  Therapeutic Goals: Patient will identify how people communicate (body language, facial expression, and electronics) Also discuss tone, voice and how these impact what is communicated and how the message is perceived.  Patient will identify feelings (such as fear or worry), thought process and behaviors related to why people internalize feelings rather than express self openly. Patient will identify two changes they are willing to make to overcome communication barriers. Members will then practice through Role Play how to communicate by utilizing psycho-education material (such as I Feel statements and acknowledging feelings rather than displacing on others)   Summary of Patient Progress Patient was active in group the first half and  followed along with CSW as going over the worksheet. Patient provided positive insights and able to share her response to the practice questions. Patient was respectful of her peers and left half way before group was over.     Therapeutic Modalities:   Cognitive Behavioral Therapy Solution Focused Therapy Motivational Interviewing Family Systems Approach   Sherre Lain, Latanya Presser 04/15/2022  1:41 PM

## 2022-04-16 ENCOUNTER — Encounter (HOSPITAL_COMMUNITY): Payer: Self-pay

## 2022-04-16 DIAGNOSIS — G4700 Insomnia, unspecified: Secondary | ICD-10-CM | POA: Diagnosis present

## 2022-04-16 DIAGNOSIS — F319 Bipolar disorder, unspecified: Secondary | ICD-10-CM | POA: Diagnosis not present

## 2022-04-16 DIAGNOSIS — F1220 Cannabis dependence, uncomplicated: Secondary | ICD-10-CM | POA: Diagnosis present

## 2022-04-16 LAB — HEPATIC FUNCTION PANEL
ALT: 238 U/L — ABNORMAL HIGH (ref 0–44)
AST: 210 U/L — ABNORMAL HIGH (ref 15–41)
Albumin: 4 g/dL (ref 3.5–5.0)
Alkaline Phosphatase: 42 U/L (ref 38–126)
Bilirubin, Direct: 0.1 mg/dL (ref 0.0–0.2)
Indirect Bilirubin: 0.4 mg/dL (ref 0.3–0.9)
Total Bilirubin: 0.5 mg/dL (ref 0.3–1.2)
Total Protein: 7.5 g/dL (ref 6.5–8.1)

## 2022-04-16 LAB — VITAMIN B1: Vitamin B1 (Thiamine): 107.6 nmol/L (ref 66.5–200.0)

## 2022-04-16 MED ORDER — CARIPRAZINE HCL 3 MG PO CAPS
3.0000 mg | ORAL_CAPSULE | Freq: Every day | ORAL | Status: DC
  Administered 2022-04-17 – 2022-04-19 (×3): 3 mg via ORAL
  Filled 2022-04-16 (×4): qty 1

## 2022-04-16 MED ORDER — MELATONIN 3 MG PO TABS
3.0000 mg | ORAL_TABLET | Freq: Every day | ORAL | Status: DC
  Administered 2022-04-16 – 2022-04-18 (×3): 3 mg via ORAL
  Filled 2022-04-16 (×4): qty 1

## 2022-04-16 MED ORDER — TRAZODONE HCL 100 MG PO TABS
100.0000 mg | ORAL_TABLET | Freq: Every day | ORAL | Status: DC
  Administered 2022-04-16 – 2022-04-18 (×3): 100 mg via ORAL
  Filled 2022-04-16 (×4): qty 1

## 2022-04-16 MED ORDER — ENSURE ENLIVE PO LIQD
237.0000 mL | Freq: Two times a day (BID) | ORAL | Status: DC
  Administered 2022-04-16: 237 mL via ORAL
  Filled 2022-04-16 (×8): qty 237

## 2022-04-16 NOTE — Progress Notes (Signed)
   04/15/22 2341  Psychosocial Assessment  Patient Complaints Depression;Anxiety  Eye Contact Fair  Facial Expression Anxious  Affect Anxious;Depressed (Rates Anxiety a 6# and Depression a 1#)  Speech Logical/coherent  Interaction Assertive  Motor Activity Other (Comment) (WNL)  Appearance/Hygiene Unremarkable  Behavior Characteristics Cooperative;Anxious;Calm  Mood Depressed;Anxious;Pleasant  Thought Process  Coherency WDL  Content WDL  Delusions None reported or observed  Hallucination None reported or observed  Judgment Limited  Confusion None  Danger to Self  Current suicidal ideation? Denies  Danger to Others  Danger to Others None reported or observed

## 2022-04-16 NOTE — Progress Notes (Addendum)
Patient's dad left his business card, would like a call from MD.  Business card put in patient's chart.   Darlene King   cell phone [phone]

## 2022-04-16 NOTE — Progress Notes (Signed)
NUTRITION ASSESSMENT  Pt identified as at risk on the Malnutrition Screen Tool  INTERVENTION: 1. Supplements: Ensure Plus High Protein po BID, each supplement provides 350 kcal and 20 grams of protein.   NUTRITION DIAGNOSIS: Unintentional weight loss related to sub-optimal intake as evidenced by pt report.   Goal: Pt to meet >/= 90% of their estimated nutrition needs.  Monitor:  PO intake  Assessment:  Pt admitted with alcoholic ketoacidosis. PMHx of depression and alcohol abuse. Pt was drinking 2 pints of rum daily with no eating.  Pt has been started on CIWA, receiving thiamine, folic acid and MVI. Will start on Ensure supplements given likely poor PO while alcohol abuse has persisted.    Height: Ht Readings from Last 1 Encounters:  04/13/22 5\' 4"  (1.626 m)    Weight: Wt Readings from Last 1 Encounters:  04/13/22 49 kg    Weight Hx: Wt Readings from Last 10 Encounters:  04/13/22 49 kg  04/10/22 49.9 kg  05/27/19 56.2 kg (45 %, Z= -0.12)*   * Growth percentiles are based on CDC (Girls, 2-20 Years) data.    BMI:  Body mass index is 18.54 kg/m. Pt meets criteria for normal -borderline based on current BMI.  Estimated Nutritional Needs: Kcal: 25-30 kcal/kg Protein: > 1 gram protein/kg Fluid: 1 ml/kcal  Diet Order:  Diet Order             Diet regular Room service appropriate? Yes; Fluid consistency: Thin  Diet effective now                  Pt is also offered choice of unit snacks mid-morning and mid-afternoon.  Pt is eating as desired.   Lab results and medications reviewed.   Clayton Bibles, MS, RD, LDN Inpatient Clinical Dietitian Contact information available via Amion

## 2022-04-16 NOTE — BH IP Treatment Plan (Signed)
Interdisciplinary Treatment and Diagnostic Plan Update  04/16/2022 Time of Session: 10:30am Darlene King MRN: [id number]  Principal Diagnosis: MDD (major depressive disorder), recurrent, severe, with psychosis (HCC)  Secondary Diagnoses: Principal Problem:   MDD (major depressive disorder), recurrent, severe, with psychosis (HCC)   Current Medications:  Current Facility-Administered Medications  Medication Dose Route Frequency Provider Last Rate Last Admin   alum & mag hydroxide-simeth (MAALOX/MYLANTA) 200-200-20 MG/5ML suspension 30 mL  30 mL Oral Q4H PRN Starkes-Perry, Juel Burrow, FNP       cariprazine (VRAYLAR) capsule 1.5 mg  1.5 mg Oral Daily Leevy-Asch, Brooke A, NP   1.5 mg at 04/16/22 0807   doxycycline (VIBRA-TABS) tablet 100 mg  100 mg Oral Q12H Maryagnes Amos, FNP   100 mg at 04/16/22 6644   folic acid (FOLVITE) tablet 1 mg  1 mg Oral Daily Maryagnes Amos, FNP   1 mg at 04/16/22 0347   hydrOXYzine (ATARAX) tablet 25 mg  25 mg Oral TID PRN Leevy-Kubisiak, Brooke A, NP   25 mg at 04/15/22 2100   ibuprofen (ADVIL) tablet 400 mg  400 mg Oral Q4H PRN Leevy-Romanek, Brooke A, NP   400 mg at 04/14/22 1822   LORazepam (ATIVAN) tablet 0.5 mg  0.5 mg Oral BID Attiah, Nadir, MD       magnesium hydroxide (MILK OF MAGNESIA) suspension 30 mL  30 mL Oral Daily PRN Starkes-Perry, Juel Burrow, FNP       metroNIDAZOLE (FLAGYL) tablet 500 mg  500 mg Oral Q12H Maryagnes Amos, FNP   500 mg at 04/16/22 4259   multivitamin with minerals tablet 1 tablet  1 tablet Oral Daily Maryagnes Amos, FNP   1 tablet at 04/16/22 0805   nicotine polacrilex (NICORETTE) gum 2 mg  2 mg Oral PRN Ajibola, Ene A, NP   2 mg at 04/15/22 1806   ondansetron (ZOFRAN-ODT) disintegrating tablet 4 mg  4 mg Oral Q8H PRN Leevy-Marro, Brooke A, NP   4 mg at 04/15/22 0744   polyethylene glycol (MIRALAX / GLYCOLAX) packet 17 g  17 g Oral Daily Maryagnes Amos, FNP   17 g at 04/14/22 0812    sucralfate (CARAFATE) tablet 1 g  1 g Oral TID WC & HS Maryagnes Amos, FNP   1 g at 04/16/22 5638   thiamine (Vitamin B-1) tablet 100 mg  100 mg Oral Daily Maryagnes Amos, FNP   100 mg at 04/16/22 7564   Or   thiamine (VITAMIN B1) injection 100 mg  100 mg Intravenous Daily Starkes-Perry, Juel Burrow, FNP       traZODone (DESYREL) tablet 50 mg  50 mg Oral QHS PRN Bobbitt, Shalon E, NP   50 mg at 04/15/22 2252   PTA Medications: Medications Prior to Admission  Medication Sig Dispense Refill Last Dose   medroxyPROGESTERone (DEPO-PROVERA) 150 MG/ML injection Inject 150 mg into the muscle every 3 (three) months.   Past Month   doxycycline (VIBRA-TABS) 100 MG tablet Take 100 mg by mouth 2 (two) times daily. (Patient not taking: Reported on 04/11/2022)      lamoTRIgine Starter Kit-Orange 42 x 25 MG & 7 x 100 MG KIT Take by mouth daily. (Patient not taking: Reported on 04/11/2022)      metroNIDAZOLE (FLAGYL) 500 MG tablet Take 500 mg by mouth 2 (two) times daily. (Patient not taking: Reported on 04/11/2022)      naltrexone (DEPADE) 50 MG tablet Take 50 mg by mouth daily. (Patient not taking:  Reported on 04/11/2022)      traZODone (DESYREL) 50 MG tablet Take 50-100 mg by mouth at bedtime as needed for sleep. (Patient not taking: Reported on 04/11/2022)      VRAYLAR 1.5 MG capsule Take 1.5 mg by mouth daily. (Patient not taking: Reported on 04/11/2022)       Patient Stressors: Traumatic event    Patient Strengths: Capable of independent living  Motivation for treatment/growth   Treatment Modalities: Medication Management, Group therapy, Case management,  1 to 1 session with clinician, Psychoeducation, Recreational therapy.   Physician Treatment Plan for Primary Diagnosis: MDD (major depressive disorder), recurrent, severe, with psychosis ([place]) Long Term Goal(s): Improvement in symptoms so as ready for discharge   Short Term Goals: Ability to identify changes in lifestyle to reduce  recurrence of condition will improve Ability to verbalize feelings will improve Ability to disclose and discuss suicidal ideas Ability to demonstrate self-control will improve Ability to identify and develop effective coping behaviors will improve Ability to maintain clinical measurements within normal limits will improve Compliance with prescribed medications will improve Ability to identify triggers associated with substance abuse/mental health issues will improve  Medication Management: Evaluate patient's response, side effects, and tolerance of medication regimen.  Therapeutic Interventions: 1 to 1 sessions, Unit Group sessions and Medication administration.  Evaluation of Outcomes: Progressing  Physician Treatment Plan for Secondary Diagnosis: Principal Problem:   MDD (major depressive disorder), recurrent, severe, with psychosis ([place])  Long Term Goal(s): Improvement in symptoms so as ready for discharge   Short Term Goals: Ability to identify changes in lifestyle to reduce recurrence of condition will improve Ability to verbalize feelings will improve Ability to disclose and discuss suicidal ideas Ability to demonstrate self-control will improve Ability to identify and develop effective coping behaviors will improve Ability to maintain clinical measurements within normal limits will improve Compliance with prescribed medications will improve Ability to identify triggers associated with substance abuse/mental health issues will improve     Medication Management: Evaluate patient's response, side effects, and tolerance of medication regimen.  Therapeutic Interventions: 1 to 1 sessions, Unit Group sessions and Medication administration.  Evaluation of Outcomes: Progressing   RN Treatment Plan for Primary Diagnosis: MDD (major depressive disorder), recurrent, severe, with psychosis ([place]) Long Term Goal(s): Knowledge of disease and therapeutic regimen to maintain health will  improve  Short Term Goals: Ability to remain free from injury will improve, Ability to verbalize frustration and anger appropriately will improve, Ability to demonstrate self-control, Ability to participate in decision making will improve, Ability to verbalize feelings will improve, Ability to disclose and discuss suicidal ideas, Ability to identify and develop effective coping behaviors will improve, and Compliance with prescribed medications will improve  Medication Management: RN will administer medications as ordered by provider, will assess and evaluate patient's response and provide education to patient for prescribed medication. RN will report any adverse and/or side effects to prescribing provider.  Therapeutic Interventions: 1 on 1 counseling sessions, Psychoeducation, Medication administration, Evaluate responses to treatment, Monitor vital signs and CBGs as ordered, Perform/monitor CIWA, COWS, AIMS and Fall Risk screenings as ordered, Perform wound care treatments as ordered.  Evaluation of Outcomes: Progressing   LCSW Treatment Plan for Primary Diagnosis: MDD (major depressive disorder), recurrent, severe, with psychosis ([place]) Long Term Goal(s): Safe transition to appropriate next level of care at discharge, Engage patient in therapeutic group addressing interpersonal concerns.  Short Term Goals: Engage patient in aftercare planning with referrals and resources, Increase social support, Increase ability  to appropriately verbalize feelings, Increase emotional regulation, Facilitate acceptance of mental health diagnosis and concerns, Facilitate patient progression through stages of change regarding substance use diagnoses and concerns, Identify triggers associated with mental health/substance abuse issues, and Increase skills for wellness and recovery  Therapeutic Interventions: Assess for all discharge needs, 1 to 1 time with Social worker, Explore available resources and support systems,  Assess for adequacy in community support network, Educate family and significant other(s) on suicide prevention, Complete Psychosocial Assessment, Interpersonal group therapy.  Evaluation of Outcomes: Progressing   Progress in Treatment: Attending groups: Yes. Participating in groups: Yes. Taking medication as prescribed: Yes. Toleration medication: Yes. Family/Significant other contact made: No, will contact:  patient declined consents  Patient understands diagnosis: Yes. Discussing patient identified problems/goals with staff: Yes. Medical problems stabilized or resolved: Yes. Denies suicidal/homicidal ideation: Yes. Issues/concerns per patient self-inventory: No.   New problem(s) identified: No, Describe:  none reported  New Short Term/Long Term Goal(s):   medication stabilization, elimination of SI thoughts, development of comprehensive mental wellness plan.    Patient Goals:  Pt states, "I want to work on my impulsivity"  Discharge Plan or Barriers:  Patient recently admitted. CSW will continue to follow and assess for appropriate referrals and possible discharge planning.    Reason for Continuation of Hospitalization: Anxiety Depression Medication stabilization Suicidal ideation  Estimated Length of Stay:  3-5 days  Last 3 [place] Suicide Severity Risk Score: [place] Admission (Current) from 04/13/2022 in [facility] ED to Hosp-Admission (Discharged) from 04/10/2022 in [facility] ED from 06/01/2021 in [facility] [place] No Risk No Risk No Risk       Last PHQ 2/9 Scores:     No data to display          Scribe for Treatment Team: Zachery Conch, LCSW 04/16/2022 11:02 AM

## 2022-04-16 NOTE — Group Note (Signed)
Occupational Therapy Group Note  Group Topic:Coping Skills  Group Date: 04/16/2022 Start Time: 1415 End Time: 1510 Facilitators: Brantley Stage, OT   Group Description: Group encouraged increased engagement and participation through discussion and activity focused on "Coping Ahead." Patients were split up into teams and selected a card from a stack of positive coping strategies. Patients were instructed to act out/charade the coping skill for other peers to guess and receive points for their team. Discussion followed with a focus on identifying additional positive coping strategies and patients shared how they were going to cope ahead over the weekend while continuing hospitalization stay.  Therapeutic Goal(s): Identify positive vs negative coping strategies. Identify coping skills to be used during hospitalization vs coping skills outside of hospital/at home Increase participation in therapeutic group environment and promote engagement in treatment   Participation Level: Active and Engaged   Participation Quality: Independent   Behavior: Appropriate   Speech/Thought Process: Directed, Focused, and Relevant   Affect/Mood: Appropriate   Insight: Fair   Judgement: Fair   Individualization: pt was active and engaged in their participation of group discussion/activity. New skills were identified  Modes of Intervention: Discussion and Education  Patient Response to Interventions:  Engaged and Interested    Plan: Continue to engage patient in OT groups 2 - 3x/week.  04/16/2022  Brantley Stage, OT Cornell Barman, OT

## 2022-04-16 NOTE — Progress Notes (Signed)
Pt rates depression 0/10 and anxiety 7/10. Pt reports having anxiety after AA group, is anxious about the changes she needs to make. Pt reports a good appetite, and no physical problems. Pt denies SI/HI/AVH and verbally contracts for safety. Provided support and encouragement. Pt safe on the unit. Q 15 minute safety checks continued.

## 2022-04-16 NOTE — Progress Notes (Cosign Needed Addendum)
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 She reported drinking 2 pints of rum night before with persistent nausea and vomiting. She was treated medically for Alcohol Ketoacidosis, alcohol gastroduodenitis, hypokalemia, heaptosteatosis, LFT elevation with concerns for unspecified mood disorder. Patient was assessed by psychiatric service while in hospital and referred for inpatient hospitalization.    24 hour assessment: Vital signs within normal limits with the exception of her heart rate slightly elevated at 103.  Compliant with medications. No behavioral issues noted over the past 24 hrs. Attending some unit group sessions & participating. Required Trazodone and Hydroxyzine last night for sleep and anxiety respectively.   Patient assessment note, 04/16/2022: Pt presents today with a depressed mood, and affect is congruent. Her attention to personal hygiene and grooming is fair, eye contact is good, speech is clear & coherent. Thought contents are organized and logical, and pt currently denies SI/HI/AVH or paranoia. There is no evidence of delusional thoughts.  She  reports that her sleep quality here at the hospital "has not been the best", rates sleep as fair, but states to writer that she does not want an increase in her sleep medication. Reports a good appetite,  denies being in any physical pain.   Pt asked to be educated on diagnosis given to her when she was admitted to [facility], and was educated on MDD which is her admitting diagnosis. Pt describes manic type symptoms which she states she had for several weeks prior to this hospitalization; she states that starting approximately January 8th when she went back to school, she began spending a lot of money on alcohol, making impulsive decisions regarding her classes, having "a lot of sex", also had an excessive amount of energy during that time, was not eating or sleeping well, but had a decreased need for both, but was consuming a lot of alcohol. She denies feeling suicidal in that time frame, but states that she had some feelings of guilt for her alcohol use, but those feelings were insignificant at the time, and she continued drinking. Pt states that she presented to the hospital because she was having vomiting and abdominal pain related to her alcohol consumption and her parents convinced her to come to [facility] for alcohol use d/o treatment. Pt reported to Dr. Viviano Simas during consult to psychiatry on 1/26 that: "Over the past 2 weeks patient endorses depressive symptoms that are consistent with anhedonia, guilt, isolation and withdrawn, insomnia, poor appetite, and suicidal thoughts."  Pt reports that she has taken Vraylar in the past for mood stabilization, but did not give it enough time to be effective. She is agreeable to continuing this medication for stabilization of her mood. Based on the new information obtained from pt today along with information in consult note to Psychiatry service while pt was still at the medical floor, we are changing current diagnoses of MDD to bipolar 1 d/o with current episode depressed. It  is possible that pt might have had timelines of mania and depressive symptoms wrong.  Pt has been educated on the benefits, rationales and possible side effects of Vraylar in addition to the fact that this  medication remains effects in treatment of bipolar d/o.   No TD/EPS type symptoms found on assessment, and pt denies any feelings of stiffness. AIMS: 0.  Patient is continuing to require continuous hospitalization for stabilization of her mood, and is benefiting from attending unit group sessions and learning coping mechanisms for her anxiety and impulsivity.  We are increasing Vraylar from 1.5 mg to 3 mg daily for mood stabilization.  We are starting trazodone 100 mg nightly and melatonin 5 mg nightly for insomnia.  We are continuing other medications as listed below.  Past Psychiatric History: bipolar   Past Medical History: History reviewed. No pertinent past medical history.  Past Surgical History:  Procedure Laterality Date   TONSILLECTOMY     Family History:  Family History  Problem Relation Age of Onset   Prostate cancer Paternal Grandfather    Family Psychiatric History: substance abuse, bipolar (m) grandfather; bipolar (m) uncle, 1st cousin (his daughter). All have been hospitalized. Mother- anxiety, sister- BPD, anxiety, ADHD, OCD, bipolar   Social History:  Social History   Substance and Sexual Activity  Alcohol Use Yes     Social History   Substance and Sexual Activity  Drug Use Yes   Types: Marijuana    Social History   Socioeconomic History   Marital status: Single    Spouse name: Not on file   Number of children: Not on file   Years of education: Not on file   Highest education level: Not on file  Occupational History   Not on file  Tobacco Use   Smoking status: Every Day   Smokeless tobacco: Never  Vaping Use   Vaping Use: Every day  Substance and Sexual Activity   Alcohol use: Yes   Drug use: Yes    Types: Marijuana   Sexual activity: Not on file  Other Topics Concern   Not on file  Social History Narrative   Not on file   Social Determinants of Health   Financial Resource Strain: Not on file  Food Insecurity: No Food Insecurity (04/13/2022)    Hunger Vital Sign    Worried About Running Out of Food in the Last Year: Never true    Ran Out of Food in the Last Year: Never true  Transportation Needs: No Transportation Needs (04/13/2022)   PRAPARE - Hydrologist (Medical): No    Lack of Transportation (Non-Medical): No  Recent Concern: Transportation Needs - Unmet Transportation Needs (04/11/2022)   PRAPARE - Hydrologist (Medical): Yes    Lack of Transportation (Non-Medical): Yes  Physical Activity: Not on file  Stress: Not on file  Social Connections: Not on file   Additional Social History:   Sleep: Fair  Appetite:   improving  Current Medications: Current Facility-Administered Medications  Medication Dose Route Frequency Provider Last Rate Last Admin   alum & mag hydroxide-simeth (MAALOX/MYLANTA) 200-200-20 MG/5ML suspension 30 mL  30 mL Oral Q4H PRN Starkes-Perry, Gayland Curry, FNP       [START ON 04/17/2022] cariprazine (VRAYLAR) capsule 3 mg  3 mg Oral Daily Massengill, Nathan, MD       doxycycline (VIBRA-TABS) tablet 100 mg  100 mg Oral Q12H Starkes-Perry, Gayland Curry, FNP   100 mg at  04/16/22 0808   feeding supplement (ENSURE ENLIVE / ENSURE PLUS) liquid 237 mL  237 mL Oral BID BM Massengill, Ovid Curd, MD   237 mL at 97/67/34 1937   folic acid (FOLVITE) tablet 1 mg  1 mg Oral Daily Suella Broad, FNP   1 mg at 04/16/22 9024   hydrOXYzine (ATARAX) tablet 25 mg  25 mg Oral TID PRN Leevy-Backs, Brooke A, NP   25 mg at 04/15/22 2100   ibuprofen (ADVIL) tablet 400 mg  400 mg Oral Q4H PRN Leevy-Bayless, Brooke A, NP   400 mg at 04/14/22 1822   LORazepam (ATIVAN) tablet 0.5 mg  0.5 mg Oral BID Winfred Leeds, Nadir, MD   0.5 mg at 04/16/22 1650   magnesium hydroxide (MILK OF MAGNESIA) suspension 30 mL  30 mL Oral Daily PRN Starkes-Perry, Gayland Curry, FNP       melatonin tablet 3 mg  3 mg Oral QHS Massengill, Nathan, MD       metroNIDAZOLE (FLAGYL) tablet 500 mg  500 mg Oral Q12H  Suella Broad, FNP   500 mg at 04/16/22 1651   multivitamin with minerals tablet 1 tablet  1 tablet Oral Daily Suella Broad, FNP   1 tablet at 04/16/22 0805   nicotine polacrilex (NICORETTE) gum 2 mg  2 mg Oral PRN Ajibola, Ene A, NP   2 mg at 04/16/22 1241   ondansetron (ZOFRAN-ODT) disintegrating tablet 4 mg  4 mg Oral Q8H PRN Leevy-Heinecke, Brooke A, NP   4 mg at 04/15/22 0744   polyethylene glycol (MIRALAX / GLYCOLAX) packet 17 g  17 g Oral Daily Suella Broad, FNP   17 g at 04/14/22 0812   sucralfate (CARAFATE) tablet 1 g  1 g Oral TID WC & HS Suella Broad, FNP   1 g at 04/16/22 1652   thiamine (Vitamin B-1) tablet 100 mg  100 mg Oral Daily Suella Broad, FNP   100 mg at 04/16/22 0973   Or   thiamine (VITAMIN B1) injection 100 mg  100 mg Intravenous Daily Starkes-Perry, Gayland Curry, FNP       traZODone (DESYREL) tablet 100 mg  100 mg Oral QHS Massengill, Ovid Curd, MD       traZODone (DESYREL) tablet 50 mg  50 mg Oral QHS PRN Bobbitt, Shalon E, NP   50 mg at 04/15/22 2252   Lab Results:  Results for orders placed or performed during the hospital encounter of 04/13/22 (from the past 48 hour(s))  Hepatic function panel     Status: Abnormal   Collection Time: 04/15/22  6:30 AM  Result Value Ref Range   Total Protein 7.4 6.5 - 8.1 g/dL   Albumin 4.1 3.5 - 5.0 g/dL   AST 292 (H) 15 - 41 U/L   ALT 263 (H) 0 - 44 U/L   Alkaline Phosphatase 43 38 - 126 U/L   Total Bilirubin 0.6 0.3 - 1.2 mg/dL   Bilirubin, Direct 0.1 0.0 - 0.2 mg/dL   Indirect Bilirubin 0.5 0.3 - 0.9 mg/dL    Comment: Performed at [facility], [place] [address], [place], [place] [address]  Hepatic function panel     Status: Abnormal   Collection Time: 04/16/22  6:21 AM  Result Value Ref Range   Total Protein 7.5 6.5 - 8.1 g/dL   Albumin 4.0 3.5 - 5.0 g/dL   AST 210 (H) 15 - 41 U/L   ALT 238 (H) 0 - 44 U/L   Alkaline  Phosphatase 42 38 - 126 U/L   Total Bilirubin  0.5 0.3 - 1.2 mg/dL   Bilirubin, Direct 0.1 0.0 - 0.2 mg/dL   Indirect Bilirubin 0.4 0.3 - 0.9 mg/dL    Comment: Performed at [facility], [address] [place], [place] [id number]   Blood Alcohol level:  Lab Results  Component Value Date   ETH 69 (H) 04/10/2022   ETH <10 06/14/2020   Metabolic Disorder Labs: Lab Results  Component Value Date   HGBA1C 5.4 06/14/2020   MPG 108.28 06/14/2020   No results found for: "PROLACTIN" Lab Results  Component Value Date   CHOL 194 06/14/2020   TRIG 54 06/14/2020   HDL 80 06/14/2020   CHOLHDL 2.4 06/14/2020   VLDL 11 06/14/2020   LDLCALC 103 (H) 06/14/2020   Physical Findings: AIMS:  , ,  ,  ,    CIWA:  CIWA-Ar Total: 4 COWS:     Musculoskeletal: Strength & Muscle Tone: within normal limits Gait & Station: normal Patient leans: N/A  Psychiatric Specialty Exam:  Presentation  General Appearance:  Appropriate for Environment; Fairly Groomed  Eye Contact: Good  Speech: Clear and Coherent  Speech Volume: Normal  Handedness: Right   Mood and Affect  Mood: Depressed  Affect: Congruent   Thought Process  Thought Processes: Coherent  Descriptions of Associations:Intact  Orientation:Full (Time, Place and Person)  Thought Content:Logical  History of Schizophrenia/Schizoaffective disorder:No data recorded Duration of Psychotic Symptoms:No data recorded Hallucinations:Hallucinations: None  Ideas of Reference:None  Suicidal Thoughts:Suicidal Thoughts: No  Homicidal Thoughts:Homicidal Thoughts: No    Sensorium  Memory: Immediate Good  Judgment: Fair  Insight: Fair   Art therapistxecutive Functions  Concentration: Fair  Attention Span: Good  Recall: Good  Fund of Knowledge: Good  Language: Good   Psychomotor Activity  Psychomotor Activity: Psychomotor Activity: Normal   Assets  Assets: Social Support   Sleep  Sleep: Sleep: Fair  Physical Exam: Physical  Exam Vitals and nursing note reviewed.  HENT:     Head: Normocephalic.     Nose: Nose normal.     Mouth/Throat:     Mouth: Mucous membranes are moist.     Pharynx: Oropharynx is clear.  Eyes:     Pupils: Pupils are equal, round, and reactive to light.  Cardiovascular:     Rate and Rhythm: Normal rate.     Pulses: Normal pulses.  Pulmonary:     Effort: Pulmonary effort is normal.  Abdominal:     Palpations: Abdomen is soft.  Musculoskeletal:        General: Normal range of motion.     Cervical back: Normal range of motion.  Skin:    General: Skin is warm and dry.  Neurological:     Mental Status: She is alert and oriented to person, place, and time. Mental status is at baseline.  Psychiatric:        Attention and Perception: Attention and perception normal.        Mood and Affect: Mood and affect normal.        Speech: Speech normal.        Behavior: Behavior normal. Behavior is cooperative.        Thought Content: Thought content normal.        Cognition and Memory: Cognition and memory normal.        Judgment: Judgment normal.    Review of Systems  HENT: Negative.    Eyes: Negative.   Respiratory: Negative.  Cardiovascular: Negative.   Gastrointestinal: Negative.   Genitourinary: Negative.   Musculoskeletal: Negative.   Skin: Negative.   Neurological: Negative.   Psychiatric/Behavioral:  Positive for substance abuse.   All other systems reviewed and are negative.  Blood pressure 117/86, pulse (!) 101, temperature 98.5 F (36.9 C), temperature source Oral, resp. rate 16, height 5\' 4"  (1.626 m), weight 49 kg, SpO2 100 %. Body mass index is 18.54 kg/m.  Treatment Plan Summary: Daily contact with patient to assess and evaluate symptoms and progress in treatment and Medication management  Observation Level/Precautions:  15 minute checks  Laboratory:  Labs reviewed   Psychotherapy:  Unit Group sessions  Medications:  See [facility]  Consultations:  To be determined    Discharge Concerns:  Safety, medication compliance, mood stability  Estimated LOS: 5-7 days  Other:  N/A   Labs reviewed 04/16/2022: Lipid panel, repeat hepatic function panel, hemoglobin A1c, to be drawn tomorrow morning.  Toxicology screen positive for opioids, positive for THC.  Patient denies history of opioid abuse, it is possible that opioids were given in the ER prior to urine being collected.  She is being treated for a UTI as her UA reflects this.  She denies symptoms at this time.  Vitamin B 12 WNL, B1 WNL, TSH WNL, CMP from 1/27 reflects potassium level of 3.1, rechecked and within normal limits.  CBC reviewed.  QTc 448.  PLAN Safety and Monitoring: Voluntary admission to inpatient psychiatric unit for safety, stabilization and treatment Daily contact with patient to assess and evaluate symptoms and progress in treatment Patient's case to be discussed in multi-disciplinary team meeting Observation Level : q15 minute checks Vital signs: q12 hours Precautions: Safety  Long Term Goal(s): Improvement in symptoms so as ready for discharge  Short Term Goals: Ability to identify changes in lifestyle to reduce recurrence of condition will improve, Ability to verbalize feelings will improve, Ability to disclose and discuss suicidal ideas, Ability to identify and develop effective coping behaviors will improve, Ability to maintain clinical measurements within normal limits will improve, Compliance with prescribed medications will improve, and Ability to identify triggers associated with substance abuse/mental health issues will improve  Diagnoses:  Principal Problem:   Bipolar 1 disorder, depressed (HCC) Active Problems:   Alcohol use disorder   Anxiety   Insomnia   Delta-9-tetrahydrocannabinol (THC) dependence (HCC)  Medications -Increase Vraylar to 3 mg starting 1/30 for mood stabilization -Start trazodone 100 mg nightly for insomnia -Start Melatonin 5 mg nightly for  insomnia -Continue PRN trazodone 50 mg nightly for sleep -Continue Miralax daily for constipation -Continue Hydroxyzine 25 mg every 6 hours PRN -Continue Sucralfate  1 g TID to prevent GI upset prior to meals -Continue Nicorette gum PRN for nicotine addiction  Alcohol use d/o -Continue Ativan detox protocol -Monitor for signs of withdrawal - Oral thiamine and MVI replacement as per MAR - Abstinence from substances encouraged - SW to look into options for outpatient SA treatment at discharge   Antibiotics prescribed by medical team for reasons as follows: As per Dr 2/30, Geraldo Pitter. M.D (04/12/2022), "Patient was recently seen on 03/28/22 for UA showed small leukocytes, trace blood, negative nitrites. Urine culture showed mixed urogenital flora <10K colonies. Per student health, patient is also positive for BV and chlamydia. She has not been taking these medications. Will continue treatment given her symptoms. Patient amenable to further STI testing. RPR negative. Her contraception is Depo-Provera injections, next dose in February. Given concern for antibiotics affecting pharmacokinetics of contraception,  will advise patient to use second form of contraception at discharge. -Continue doxycycline 100mg  x7 days (day 2), metronidazole 500mg  x7 days (day 2), nitrofurantoin 100mg  x5 days (day 2) -Pending GC/Chlamydia-ordered -Pending urine pregnancy-ordered  -Continue doxycycline 100 mg x 7 days -End date 1/31@ 0800 -Continue Flagyl 500 mg x 7 days, then stop-01/31 -Continue Nitrofurantoin 100 mg x 5 days, then stop-ended 1/29 @ 0800  Other PRNS -Continue Tylenol 650 mg every 6 hours PRN for mild pain -Continue Maalox 30 mg every 4 hrs PRN for indigestion -Continue Imodium 2-4 mg as needed for diarrhea -Continue Milk of Magnesia as needed every 6 hrs for constipation -Continue Zofran disintegrating tabs every 6 hrs PRN for nausea   Discharge Planning: Social work and case management to assist with  discharge planning and identification of hospital follow-up needs prior to discharge Estimated LOS: 5-7 days Discharge Concerns: Need to establish a safety plan; Medication compliance and effectiveness Discharge Goals: Return home with outpatient referrals for mental health follow-up including medication management/psychotherapy  I certify that inpatient services furnished can reasonably be expected to improve the patient's condition.    04-14-1973, NP 1/29/20245:05 PM

## 2022-04-16 NOTE — Progress Notes (Addendum)
D:  Patient's self inventory sheet, patient has fair sleep, sleep medication helpful.  Good appetite, high energy level, good concentration.  Denied depression, hopeless and anxiety.  Denied withdrawals.  Denied SI.  Denied physical problems.  Denied physical pain.  Appreciate time and memories with people I meet here and be genuinely happy for their progress.  Focus more on myself.  Accept that we are all here to get better, meditate and journel.  Does have discharge plans. A:  Medications administered per MD orders.  Emotional support and encouragement given patient. R:  Denied SI and HI, contracts for safety.  Denied A/V hallucinations.  Safety maintained with 15 minute checks.

## 2022-04-16 NOTE — [facility] Group Notes (Signed)
Spiritual care group on grief and loss facilitated by chaplain Eisha Chatterjee Andree Elk and Lysle Morales, counseling intern.   Group Goal: Support / Education around grief and loss  Members engage in facilitated group support and psycho-social education.  Group Description:  Following introductions and group rules, group members engaged in facilitated group dialogue and support around topic of loss, with particular support around experiences of loss in their lives. Group Identified types of loss (relationships / self / things) and identified patterns, circumstances, and changes that precipitate losses. Reflected on thoughts / feelings around loss, normalized grief responses, and recognized variety in grief experience. Group encouraged individual reflection on safe space and on the coping skills that they are already utilizing.   Group drew on Adlerian / Rogerian and narrative framework  Patient Progress: Darlene King left at the beginning of group,  but later was involved in group as it was moved to another room.  She was actively listening and encouraging to her peers.  She requested for a one-on-one visit.

## 2022-04-16 NOTE — Plan of Care (Signed)
Nurse discussed anxiety, depression and coping skills with patient.  

## 2022-04-17 ENCOUNTER — Encounter (HOSPITAL_COMMUNITY): Payer: Self-pay | Admitting: Family

## 2022-04-17 DIAGNOSIS — F319 Bipolar disorder, unspecified: Secondary | ICD-10-CM | POA: Diagnosis not present

## 2022-04-17 LAB — HEMOGLOBIN A1C
Hgb A1c MFr Bld: 5.4 % (ref 4.8–5.6)
Mean Plasma Glucose: 108.28 mg/dL

## 2022-04-17 LAB — HEPATIC FUNCTION PANEL
ALT: 217 U/L — ABNORMAL HIGH (ref 0–44)
AST: 157 U/L — ABNORMAL HIGH (ref 15–41)
Albumin: 3.8 g/dL (ref 3.5–5.0)
Alkaline Phosphatase: 41 U/L (ref 38–126)
Bilirubin, Direct: <0.1 mg/dL (ref 0.0–0.2)
Total Bilirubin: 0.6 mg/dL (ref 0.3–1.2)
Total Protein: 7.7 g/dL (ref 6.5–8.1)

## 2022-04-17 LAB — LIPID PANEL
Cholesterol: 183 mg/dL (ref 0–200)
HDL: 72 mg/dL (ref >40)
LDL Cholesterol: 100 mg/dL — ABNORMAL HIGH (ref 0–99)
Total CHOL/HDL Ratio: 2.5 RATIO
Triglycerides: 56 mg/dL (ref <150)
VLDL: 11 mg/dL (ref 0–40)

## 2022-04-17 NOTE — Progress Notes (Signed)
Patient rated her day as a 9 out of 10. She states that she ate more today and was able to think less today. Her goal for tomorrow is to communicate more with her treatment team.

## 2022-04-17 NOTE — Progress Notes (Addendum)
   04/17/22 1000  Psych Admission Type (Psych Patients Only)  Admission Status Involuntary  Psychosocial Assessment  Patient Complaints Anxiety  Eye Contact Fair  Facial Expression Anxious  Affect Appropriate to circumstance  Speech Logical/coherent  Interaction Assertive  Motor Activity Other (Comment) (steady gait)  Appearance/Hygiene Unremarkable  Behavior Characteristics Cooperative  Mood Pleasant;Anxious  Thought Process  Coherency WDL  Content WDL  Delusions None reported or observed  Perception WDL  Hallucination None reported or observed  Judgment Impaired  Confusion None  Danger to Self  Current suicidal ideation? Denies  Danger to Others  Danger to Others None reported or observed

## 2022-04-17 NOTE — Group Note (Signed)
[facility] LCSW Group Therapy Note   Group Date: 04/17/2022 Start Time: 1100 End Time: 1200   Type of Therapy and Topic: Group Therapy: Avoiding Self-Sabotaging and Enabling Behaviors  Participation Level: Active  Mood: Good  Description of Group:  In this group, patients will learn how to identify obstacles, self-sabotaging and enabling behaviors, as well as: what are they, why do we do them and what needs these behaviors meet. Discuss unhealthy relationships and how to have positive healthy boundaries with those that sabotage and enable. Explore aspects of self-sabotage and enabling in yourself and how to limit these self-destructive behaviors in everyday life.   Therapeutic Goals: 1. Patient will identify one obstacle that relates to self-sabotage and enabling behaviors 2. Patient will identify one personal self-sabotaging or enabling behavior they did prior to admission 3. Patient will state a plan to change the above identified behavior 4. Patient will demonstrate ability to communicate their needs through discussion and/or role play.    Summary of Patient Progress:   Pt provided appropriate insight and was engaged throughout the group session   Therapeutic Modalities:  Cognitive Behavioral Therapy Person-Centered Therapy Motivational Interviewing    [place], LCSW

## 2022-04-17 NOTE — Progress Notes (Signed)
Patient attended [place] group this afternoon

## 2022-04-17 NOTE — Progress Notes (Signed)
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 She reported drinking 2 pints of rum night before with persistent nausea and vomiting. She was treated medically for Alcohol Ketoacidosis, alcohol gastroduodenitis, hypokalemia, heaptosteatosis, LFT elevation with concerns for unspecified mood disorder. Patient was assessed by psychiatric service while in hospital and referred for inpatient hospitalization.    24 hour assessment: Vital signs within normal limits. Pt remains compliant with  her scheduled medications. No behavioral issues noted over the past 24 hrs. Pt is continuing to attend unit group sessions & is continuing to participate. Took scheduled dose of Trazodone 100 mg last night for insomnia.   Patient assessment note, 04/17/2022: Mood is continuing to improve as per both objective and subjective assessments on a daily basis. Pt denies SI, denies HI, denies AVH, denies paranoia. She reports that she was able to sleep with the addition of Trazodone 100 mg nightly to her medication regimen. She states that she slept 5 hours total, but this is most likely not accurate, as staff reported that pt slept through the night and did not require the additional 50 mg of Trazodone PR.    She reports that the increased dose of Vraylar is helpful with her mood, and she is feeling  like has mood is better today. She reports a good appetite, denies any medication related side effects. She reports pain in her RLQ, but as per chart review, pt complained of same pain while hospitalized on the medical unit, and was medically worked up and cleared after being treated for  alcohol Ketoacidosis, alcohol gastroduodenitis, and heaptosteatosis.   We are continuing medications as listed below, and continuing to monitor responses to medications. Pt is benefiting from continuous hospitalization as she reorts that she is benefiting from the group sessions being held here on the unit. She states today that her plan is to start attending the alcoholics anonymous meetings and get a sponsor after discharge to maintain her sobriety. We are continuing medications as listed below with no changes today.  Past Psychiatric History: bipolar d/o  Past Medical History: History reviewed. No pertinent past medical history.  Past Surgical History:  Procedure Laterality Date   TONSILLECTOMY     Family History:  Family History  Problem Relation Age of Onset   Prostate cancer Paternal Grandfather    Family Psychiatric History: substance abuse, bipolar (m) grandfather; bipolar (m) uncle, 1st cousin (his daughter). All have been hospitalized. Mother- anxiety, sister- BPD, anxiety, ADHD, OCD, bipolar   Social History:  Social History   Substance and Sexual Activity  Alcohol Use Yes     Social History   Substance and Sexual Activity  Drug Use Yes   Types: Marijuana    Social History   Socioeconomic History   Marital status: Single    Spouse name: Not on file   Number of children: Not on file   Years of education: Not on file  Highest education level: Not on file  Occupational History   Not on file  Tobacco Use   Smoking status: Every Day   Smokeless tobacco: Never  Vaping Use   Vaping Use: Every day  Substance and Sexual Activity   Alcohol use: Yes   Drug use: Yes    Types:  Marijuana   Sexual activity: Not on file  Other Topics Concern   Not on file  Social History Narrative   Not on file   Social Determinants of Health   Financial Resource Strain: Not on file  Food Insecurity: No Food Insecurity (04/13/2022)   Hunger Vital Sign    Worried About Running Out of Food in the Last Year: Never true    Ran Out of Food in the Last Year: Never true  Transportation Needs: No Transportation Needs (04/13/2022)   PRAPARE - Administrator, Civil Service (Medical): No    Lack of Transportation (Non-Medical): No  Recent Concern: Transportation Needs - Unmet Transportation Needs (04/11/2022)   PRAPARE - Administrator, Civil Service (Medical): Yes    Lack of Transportation (Non-Medical): Yes  Physical Activity: Not on file  Stress: Not on file  Social Connections: Not on file   Additional Social History:   Sleep: Fair  Appetite:   improving  Current Medications: Current Facility-Administered Medications  Medication Dose Route Frequency Provider Last Rate Last Admin   alum & mag hydroxide-simeth (MAALOX/MYLANTA) 200-200-20 MG/5ML suspension 30 mL  30 mL Oral Q4H PRN Starkes-Perry, Juel Burrow, FNP       cariprazine (VRAYLAR) capsule 3 mg  3 mg Oral Daily Massengill, Nathan, MD   3 mg at 04/17/22 0743   doxycycline (VIBRA-TABS) tablet 100 mg  100 mg Oral Q12H Maryagnes Amos, FNP   100 mg at 04/17/22 0743   feeding supplement (ENSURE ENLIVE / ENSURE PLUS) liquid 237 mL  237 mL Oral BID BM Massengill, Harrold Donath, MD   237 mL at 04/16/22 1506   folic acid (FOLVITE) tablet 1 mg  1 mg Oral Daily Maryagnes Amos, FNP   1 mg at 04/17/22 6063   hydrOXYzine (ATARAX) tablet 25 mg  25 mg Oral TID PRN Leevy-Caples, Brooke A, NP   25 mg at 04/16/22 2120   ibuprofen (ADVIL) tablet 400 mg  400 mg Oral Q4H PRN Leevy-Thibodaux, Brooke A, NP   400 mg at 04/14/22 1822   magnesium hydroxide (MILK OF MAGNESIA) suspension 30 mL  30 mL Oral Daily PRN  Starkes-Perry, Juel Burrow, FNP       melatonin tablet 3 mg  3 mg Oral QHS Massengill, Nathan, MD   3 mg at 04/16/22 2120   metroNIDAZOLE (FLAGYL) tablet 500 mg  500 mg Oral Q12H Maryagnes Amos, FNP   500 mg at 04/17/22 0160   multivitamin with minerals tablet 1 tablet  1 tablet Oral Daily Maryagnes Amos, FNP   1 tablet at 04/17/22 0743   nicotine polacrilex (NICORETTE) gum 2 mg  2 mg Oral PRN Ajibola, Ene A, NP   2 mg at 04/17/22 1414   ondansetron (ZOFRAN-ODT) disintegrating tablet 4 mg  4 mg Oral Q8H PRN Leevy-Tester, Brooke A, NP   4 mg at 04/15/22 0744   polyethylene glycol (MIRALAX / GLYCOLAX) packet 17 g  17 g Oral Daily Maryagnes Amos, FNP   17 g at 04/14/22 0812   sucralfate (CARAFATE) tablet 1 g  1 g Oral TID WC & HS Starkes-Perry, Juel Burrow,  FNP   1 g at 04/17/22 1203   thiamine (Vitamin B-1) tablet 100 mg  100 mg Oral Daily Maryagnes Amos, FNP   100 mg at 04/17/22 4132   Or   thiamine (VITAMIN B1) injection 100 mg  100 mg Intravenous Daily Maryagnes Amos, FNP       traZODone (DESYREL) tablet 100 mg  100 mg Oral QHS Massengill, Harrold Donath, MD   100 mg at 04/16/22 2120   traZODone (DESYREL) tablet 50 mg  50 mg Oral QHS PRN Bobbitt, Shalon E, NP   50 mg at 04/15/22 2252   Lab Results:  Results for orders placed or performed during the hospital encounter of 04/13/22 (from the past 48 hour(s))  Hepatic function panel     Status: Abnormal   Collection Time: 04/16/22  6:21 AM  Result Value Ref Range   Total Protein 7.5 6.5 - 8.1 g/dL   Albumin 4.0 3.5 - 5.0 g/dL   AST 440 (H) 15 - 41 U/L   ALT 238 (H) 0 - 44 U/L   Alkaline Phosphatase 42 38 - 126 U/L   Total Bilirubin 0.5 0.3 - 1.2 mg/dL   Bilirubin, Direct 0.1 0.0 - 0.2 mg/dL   Indirect Bilirubin 0.4 0.3 - 0.9 mg/dL    Comment: Performed at [facility], [address], [place], [place] [address]  Hemoglobin A1c     Status: None   Collection Time: 04/17/22  6:22 AM  Result Value  Ref Range   Hgb A1c MFr Bld 5.4 4.8 - 5.6 %    Comment: (NOTE) Pre diabetes:          5.7%-6.4%  Diabetes:              >6.4%  Glycemic control for   <7.0% adults with diabetes    Mean Plasma Glucose 108.28 mg/dL    Comment: Performed at [facility], [address], [place], [place] [address]  Hepatic function panel     Status: Abnormal   Collection Time: 04/17/22  6:22 AM  Result Value Ref Range   Total Protein 7.7 6.5 - 8.1 g/dL   Albumin 3.8 3.5 - 5.0 g/dL   AST 403 (H) 15 - 41 U/L   ALT 217 (H) 0 - 44 U/L   Alkaline Phosphatase 41 38 - 126 U/L   Total Bilirubin 0.6 0.3 - 1.2 mg/dL   Bilirubin, Direct <4.7 0.0 - 0.2 mg/dL   Indirect Bilirubin NOT CALCULATED 0.3 - 0.9 mg/dL    Comment: Performed at [facility], [address], [place], [place] [address]  Lipid panel     Status: Abnormal   Collection Time: 04/17/22  6:22 AM  Result Value Ref Range   Cholesterol 183 0 - 200 mg/dL   Triglycerides 56 <638 mg/dL   HDL 72 >75 mg/dL   Total CHOL/HDL Ratio 2.5 RATIO   VLDL 11 0 - 40 mg/dL   LDL Cholesterol 643 (H) 0 - 99 mg/dL    Comment:        Total Cholesterol/HDL:CHD Risk Coronary Heart Disease Risk Table                     Men   Women  1/2 Average Risk   3.4   3.3  Average Risk       5.0   4.4  2 X Average Risk   9.6   7.1  3 X Average Risk  23.4  11.0        Use the calculated Patient Ratio above and the CHD Risk Table to determine the patient's CHD Risk.        ATP III CLASSIFICATION (LDL):  <100     mg/dL   Optimal  100-129  mg/dL   Near or Above                    Optimal  130-159  mg/dL   Borderline  160-189  mg/dL   High  >190     mg/dL   Very High Performed at [facility] [address] [place], [place] [address]    Blood Alcohol level:  Lab Results  Component Value Date   ETH 69 (H) 04/10/2022   ETH <10 62/37/6283   Metabolic Disorder Labs: Lab Results  Component Value Date   HGBA1C 5.4  04/17/2022   MPG 108.28 04/17/2022   MPG 108.28 06/14/2020   No results found for: "PROLACTIN" Lab Results  Component Value Date   CHOL 183 04/17/2022   TRIG 56 04/17/2022   HDL 72 04/17/2022   CHOLHDL 2.5 04/17/2022   VLDL 11 04/17/2022   LDLCALC 100 (H) 04/17/2022   LDLCALC 103 (H) 06/14/2020   Physical Findings: AIMS:  , ,  ,  ,    CIWA:  CIWA-Ar Total: 4 COWS:     Musculoskeletal: Strength & Muscle Tone: within normal limits Gait & Station: normal Patient leans: N/A  Psychiatric Specialty Exam:  Presentation  General Appearance:  Appropriate for Environment; Fairly Groomed  Eye Contact: Fair  Speech: Clear and Coherent  Speech Volume: Normal  Handedness: Right   Mood and Affect  Mood: Depressed  Affect: Congruent   Thought Process  Thought Processes: Coherent  Descriptions of Associations:Intact  Orientation:Full (Time, Place and Person)  Thought Content:Logical  History of Schizophrenia/Schizoaffective disorder:No data recorded Duration of Psychotic Symptoms:No data recorded Hallucinations:Hallucinations: None  Ideas of Reference:None  Suicidal Thoughts:Suicidal Thoughts: No  Homicidal Thoughts:Homicidal Thoughts: No    Sensorium  Memory: Immediate Good  Judgment: Good  Insight: Good   Executive Functions  Concentration: Good  Attention Span: Good  Recall: Good  Fund of Knowledge: Good  Language: Good   Psychomotor Activity  Psychomotor Activity: Psychomotor Activity: Normal   Assets  Assets: Communication Skills   Sleep  Sleep: Sleep: Fair  Physical Exam: Physical Exam Vitals and nursing note reviewed.  HENT:     Head: Normocephalic.     Nose: Nose normal.     Mouth/Throat:     Mouth: Mucous membranes are moist.     Pharynx: Oropharynx is clear.  Eyes:     Pupils: Pupils are equal, round, and reactive to light.  Cardiovascular:     Rate and Rhythm: Normal rate.     Pulses: Normal  pulses.  Pulmonary:     Effort: Pulmonary effort is normal.  Abdominal:     Palpations: Abdomen is soft.  Musculoskeletal:        General: Normal range of motion.     Cervical back: Normal range of motion.  Skin:    General: Skin is warm and dry.  Neurological:     Mental Status: She is alert and oriented to person, place, and time. Mental status is at baseline.  Psychiatric:        Attention and Perception: Attention and perception normal.        Mood and Affect: Mood and affect normal.  Speech: Speech normal.        Behavior: Behavior normal. Behavior is cooperative.        Thought Content: Thought content normal.        Cognition and Memory: Cognition and memory normal.        Judgment: Judgment normal.    Review of Systems  HENT: Negative.    Eyes: Negative.   Respiratory: Negative.    Cardiovascular: Negative.   Gastrointestinal: Negative.   Genitourinary: Negative.   Musculoskeletal: Negative.   Skin: Negative.   Neurological: Negative.   Psychiatric/Behavioral:  Positive for depression and substance abuse. Negative for hallucinations and memory loss. The patient is nervous/anxious and has insomnia.   All other systems reviewed and are negative.  Blood pressure 109/75, pulse 97, temperature 99 F (37.2 C), temperature source Oral, resp. rate 16, height 5\' 4"  (1.626 m), weight 49 kg, SpO2 100 %. Body mass index is 18.54 kg/m.  Treatment Plan Summary: Daily contact with patient to assess and evaluate symptoms and progress in treatment and Medication management  Observation Level/Precautions:  15 minute checks  Laboratory:  Labs reviewed   Psychotherapy:  Unit Group sessions  Medications:  See [facility]  Consultations:  To be determined   Discharge Concerns:  Safety, medication compliance, mood stability  Estimated LOS: 5-7 days  Other:  N/A   Labs reviewed 04/16/2022: Lipid panel, repeat hepatic function panel, hemoglobin A1c, to be drawn tomorrow morning.   Toxicology screen positive for opioids, positive for THC.  Patient denies history of opioid abuse, it is possible that opioids were given in the ER prior to urine being collected.  She is being treated for a UTI as her UA reflects this.  She denies symptoms at this time.  Vitamin B 12 WNL, B1 WNL, TSH WNL, CMP from 1/27 reflects potassium level of 3.1, rechecked and within normal limits.  CBC reviewed.  QTc 448.  PLAN Safety and Monitoring: Voluntary admission to inpatient psychiatric unit for safety, stabilization and treatment Daily contact with patient to assess and evaluate symptoms and progress in treatment Patient's case to be discussed in multi-disciplinary team meeting Observation Level : q15 minute checks Vital signs: q12 hours Precautions: Safety  Long Term Goal(s): Improvement in symptoms so as ready for discharge  Short Term Goals: Ability to identify changes in lifestyle to reduce recurrence of condition will improve, Ability to verbalize feelings will improve, Ability to disclose and discuss suicidal ideas, Ability to identify and develop effective coping behaviors will improve, Ability to maintain clinical measurements within normal limits will improve, Compliance with prescribed medications will improve, and Ability to identify triggers associated with substance abuse/mental health issues will improve  Diagnoses:  Principal Problem:   Bipolar 1 disorder, depressed ([place]) Active Problems:   Alcohol use disorder   Anxiety   Insomnia   Delta-9-tetrahydrocannabinol (THC) dependence (HCC)  Medications -Continue Vraylar 3 mg for mood stabilization -Continue trazodone 100 mg nightly for insomnia -Continue Melatonin 5 mg nightly for insomnia -Continue PRN trazodone 50 mg nightly for sleep -Continue Miralax daily for constipation -Continue Hydroxyzine 25 mg every 6 hours PRN -Continue Sucralfate  1 g TID to prevent GI upset prior to meals -Continue Nicorette gum PRN for nicotine  addiction  Alcohol use d/o -Continue Ativan detox protocol -Monitor for signs of withdrawal - Oral thiamine and MVI replacement as per MAR - Abstinence from substances encouraged - SW to look into options for outpatient SA treatment at discharge   Antibiotics prescribed by medical  team for reasons as follows: As per Dr Nikki Dom, Lenna Sciara. M.D (04/12/2022), "Patient was recently seen on 03/28/22 for UA showed small leukocytes, trace blood, negative nitrites. Urine culture showed mixed urogenital flora <10K colonies. Per student health, patient is also positive for BV and chlamydia. She has not been taking these medications. Will continue treatment given her symptoms. Patient amenable to further STI testing. RPR negative. Her contraception is Depo-Provera injections, next dose in February. Given concern for antibiotics affecting pharmacokinetics of contraception, will advise patient to use second form of contraception at discharge. -Continue doxycycline 100mg  x7 days (day 2), metronidazole 500mg  x7 days (day 2), nitrofurantoin 100mg  x5 days (day 2) -Pending GC/Chlamydia-ordered -Pending urine pregnancy-ordered  -Continue doxycycline 100 mg x 7 days -End date 1/31@ 0800 -Continue Flagyl 500 mg x 7 days, then stop-01/31 -Continue Nitrofurantoin 100 mg x 5 days, then stop-ended 1/29 @ 0800  Other PRNS -Continue Tylenol 650 mg every 6 hours PRN for mild pain -Continue Maalox 30 mg every 4 hrs PRN for indigestion -Continue Imodium 2-4 mg as needed for diarrhea -Continue Milk of Magnesia as needed every 6 hrs for constipation -Continue Zofran disintegrating tabs every 6 hrs PRN for nausea   Discharge Planning: Social work and case management to assist with discharge planning and identification of hospital follow-up needs prior to discharge Estimated LOS: 5-7 days Discharge Concerns: Need to establish a safety plan; Medication compliance and effectiveness Discharge Goals: Return home with outpatient  referrals for mental health follow-up including medication management/psychotherapy  I certify that inpatient services furnished can reasonably be expected to improve the patient's condition.    Nicholes Rough, NP 1/30/20242:46 PM Patient ID: Darlene King, female   DOB: July 06, 2000, 22 y.o.   MRN: [id number]

## 2022-04-17 NOTE — Progress Notes (Signed)
Adult Psychoeducational Group Note  Date:  04/17/2022 Time:  10:41 AM  Group Topic/Focus:  Goals Group:   The focus of this group is to help patients establish daily goals to achieve during treatment and discuss how the patient can incorporate goal setting into their daily lives to aide in recovery.  Participation Level:  Active  Participation Quality:  Appropriate  Affect:  Appropriate  Cognitive:  Appropriate  Insight: Appropriate and Good  Engagement in Group:  Engaged  Modes of Intervention:  Rapport Building  Additional Comments:  PT stated the need to monitor levels of anger and impulse control. PT asked general questions to improve handling such mental swings and potential breakdown  [facility] 04/17/2022, 10:41 AM

## 2022-04-18 DIAGNOSIS — F319 Bipolar disorder, unspecified: Secondary | ICD-10-CM | POA: Diagnosis not present

## 2022-04-18 NOTE — BHH Group Notes (Signed)
Adult Psychoeducational Group Note  Date:  04/18/2022 Time:  3:05 PM  Group Topic/Focus:  Goals Group:   The focus of this group is to help patients establish daily goals to achieve during treatment and discuss how the patient can incorporate goal setting into their daily lives to aide in recovery.  Participation Level:  Active  Participation Quality:  Attentive  Affect:  Appropriate  Cognitive:  Appropriate  Insight: Appropriate  Engagement in Group:  Engaged  Modes of Intervention:  Discussion  Additional Comments:  Patient attended goals group and was attentive the duration of it. Patient's goal was to get a discharge plan.   Mathius Birkeland T Kerrion Kemppainen 04/18/2022, 3:05 PM

## 2022-04-18 NOTE — Progress Notes (Signed)
   04/18/22 0600  15 Minute Checks  Location Bedroom  Visual Appearance Calm  Behavior Sleeping  Sleep (Behavioral Health Patients Only)  Calculate sleep? (Click Yes once per 24 hr at 0600 safety check) Yes  Documented sleep last 24 hours 7.5    

## 2022-04-18 NOTE — Progress Notes (Signed)
Adult Psychoeducational Group Note  Date:  04/18/2022 Time:  9:47 PM  Group Topic/Focus:  Wrap-Up Group:   The focus of this group is to help patients review their daily goal of treatment and discuss progress on daily workbooks.  Participation Level:  Active  Participation Quality:  Attentive and Sharing  Affect:  Appropriate  Cognitive:  Appropriate  Insight: Appropriate  Engagement in Group:  Engaged and Supportive  Modes of Intervention:  Discussion, Rapport Building, and Support  Additional Comments:   Pt attended and engaged in the [place] group. Pt day/mood 9/10. Pt reported progress toward school planning. Pt shared that music was an effective coping skill that helps to improve mood and mindset.  Wetzel Bjornstad Krina Mraz 04/18/2022, 9:47 PM

## 2022-04-18 NOTE — Progress Notes (Signed)
   04/18/22 2120  Psych Admission Type (Psych Patients Only)  Admission Status Involuntary  Psychosocial Assessment  Patient Complaints Anxiety  Eye Contact Fair  Facial Expression Anxious  Affect Appropriate to circumstance  Speech Logical/coherent  Interaction Assertive  Motor Activity Other (Comment) (WDL)  Appearance/Hygiene Unremarkable  Behavior Characteristics Cooperative  Mood Pleasant  Thought Process  Coherency WDL  Content WDL  Delusions None reported or observed  Perception WDL  Hallucination None reported or observed  Judgment Impaired  Confusion None  Danger to Self  Current suicidal ideation? Denies  Danger to Others  Danger to Others None reported or observed   Patient alert and oriented. Presenting appropriate to circumstance with a pleasant mood. Patient denies SI, HI, AVH, and pain. Patient endorses anxiety 4/10 and depression 0/10. Scheduled medications administered to patient, per provider orders. PRN hydroxyzine administered, per provider orders. Support and encouragement provided. Routine safety checks conducted every 15 minutes. Patient verbally contracts for safety and remains safe on the unit.

## 2022-04-18 NOTE — Progress Notes (Signed)
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 She reported drinking 2 pints of rum night before with persistent nausea and vomiting. She was treated medically for Alcohol Ketoacidosis, alcohol gastroduodenitis, hypokalemia, heaptosteatosis, LFT elevation with concerns for unspecified mood disorder. Patient was assessed by psychiatric service while in hospital and referred for inpatient hospitalization.    24 hour assessment: Vital signs with HR of 101 earlier today morning, otherwise WNL. Pt has remained compliant with  her scheduled medications. No behavioral issues noted over the past 24 hrs. Pt is continuing to attend unit group sessions & is continuing to participate. Required Hydroxyzine 25 mg earlier today morning for anxiety.   Patient assessment note, 04/18/2022: Pt presents today with a euthymic mood, affect is congruent. Her attention to personal hygiene and grooming is good, eye contact is good, speech is clear & coherent. Thought contents are organized and logical, and pt currently denies SI/HI/AVH or paranoia. There is no evidence of delusional thoughts. Pt denies being in any physical pain.  Pt reports: "Last night was the best sleep that I have gotten since being here", when asked question related to sleep. She reports a good appetite, denies medication related  side effects, and verbalizes readiness to be discharged tomorrow. She reports motivation to start attending alcoholic Anonymous meetings and get a sponsor after discharge to maintain her sobriety from alcohol. She states that she is not interested in inpatient rehab at this time. 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 Pt requires another day of inpatient hospitalization as she is continuing to benefit from inpatient hospitalization. We plan to discharge her tomorrow 04/19/22, and will continue all medications as listed below.  Past Psychiatric History: bipolar d/o  Past Medical History: History reviewed. No pertinent past medical history.  Past Surgical History:  Procedure Laterality Date   TONSILLECTOMY     Family History:  Family History  Problem Relation Age of Onset   Prostate cancer Paternal Grandfather    Family Psychiatric History: substance abuse, bipolar (m) grandfather; bipolar (m) uncle, 1st cousin (his daughter). All have been hospitalized. Mother- anxiety, sister- BPD, anxiety, ADHD, OCD, bipolar   Social History:  Social History   Substance and Sexual Activity  Alcohol Use Yes     Social History   Substance and Sexual Activity  Drug Use Yes   Types: Marijuana    Social History   Socioeconomic History   Marital status: Single    Spouse name: Not on file   Number of children: Not on file   Years of education: Not on file   Highest education level: Not on file  Occupational History   Not on file  Tobacco Use   Smoking status: Every Day   Smokeless tobacco: Never  Vaping Use   Vaping Use: Every day   Substances: Nicotine  Substance and Sexual  Activity   Alcohol use: Yes   Drug use: Yes    Types: Marijuana   Sexual activity: Not on file  Other Topics Concern   Not on file  Social History Narrative   Not on file   Social Determinants of Health   Financial Resource Strain: Not on file  Food  Insecurity: No Food Insecurity (04/13/2022)   Hunger Vital Sign    Worried About Running Out of Food in the Last Year: Never true    Ran Out of Food in the Last Year: Never true  Transportation Needs: No Transportation Needs (04/13/2022)   PRAPARE - Hydrologist (Medical): No    Lack of Transportation (Non-Medical): No  Recent Concern: Transportation Needs - Unmet Transportation Needs (04/11/2022)   PRAPARE - Hydrologist (Medical): Yes    Lack of Transportation (Non-Medical): Yes  Physical Activity: Not on file  Stress: Not on file  Social Connections: Not on file   Additional Social History:   Sleep: Fair  Appetite:   improving  Current Medications: Current Facility-Administered Medications  Medication Dose Route Frequency Provider Last Rate Last Admin   alum & mag hydroxide-simeth (MAALOX/MYLANTA) 200-200-20 MG/5ML suspension 30 mL  30 mL Oral Q4H PRN Starkes-Perry, Gayland Curry, FNP       cariprazine (VRAYLAR) capsule 3 mg  3 mg Oral Daily Massengill, Nathan, MD   3 mg at 04/18/22 3267   feeding supplement (ENSURE ENLIVE / ENSURE PLUS) liquid 237 mL  237 mL Oral BID BM Massengill, Ovid Curd, MD   237 mL at 12/45/80 9983   folic acid (FOLVITE) tablet 1 mg  1 mg Oral Daily Suella Broad, FNP   1 mg at 04/18/22 3825   hydrOXYzine (ATARAX) tablet 25 mg  25 mg Oral TID PRN Leevy-Strole, Brooke A, NP   25 mg at 04/18/22 0718   ibuprofen (ADVIL) tablet 400 mg  400 mg Oral Q4H PRN Leevy-Seminara, Brooke A, NP   400 mg at 04/14/22 1822   magnesium hydroxide (MILK OF MAGNESIA) suspension 30 mL  30 mL Oral Daily PRN Starkes-Perry, Gayland Curry, FNP       melatonin tablet 3 mg  3 mg Oral QHS Massengill, Nathan, MD   3 mg at 04/17/22 2201   multivitamin with minerals tablet 1 tablet  1 tablet Oral Daily Suella Broad, FNP   1 tablet at 04/18/22 0539   nicotine polacrilex (NICORETTE) gum 2 mg  2 mg Oral PRN Ajibola, Ene A, NP   2 mg at  04/18/22 1100   ondansetron (ZOFRAN-ODT) disintegrating tablet 4 mg  4 mg Oral Q8H PRN Leevy-Quiroa, Brooke A, NP   4 mg at 04/15/22 0744   polyethylene glycol (MIRALAX / GLYCOLAX) packet 17 g  17 g Oral Daily Suella Broad, FNP   17 g at 04/14/22 0812   sucralfate (CARAFATE) tablet 1 g  1 g Oral TID WC & HS Suella Broad, FNP   1 g at 04/18/22 1203   thiamine (Vitamin B-1) tablet 100 mg  100 mg Oral Daily Suella Broad, FNP   100 mg at 04/18/22 7673   Or   thiamine (VITAMIN B1) injection 100 mg  100 mg Intravenous Daily Suella Broad, FNP       traZODone (DESYREL) tablet 100 mg  100 mg Oral QHS Massengill, Ovid Curd, MD   100 mg at 04/17/22 2201   traZODone (DESYREL) tablet 50 mg  50  mg Oral QHS PRN Bobbitt, Shalon E, NP   50 mg at 04/15/22 2252   Lab Results:  Results for orders placed or performed during the hospital encounter of 04/13/22 (from the past 48 hour(s))  Hemoglobin A1c     Status: None   Collection Time: 04/17/22  6:22 AM  Result Value Ref Range   Hgb A1c MFr Bld 5.4 4.8 - 5.6 %    Comment: (NOTE) Pre diabetes:          5.7%-6.4%  Diabetes:              >6.4%  Glycemic control for   <7.0% adults with diabetes    Mean Plasma Glucose 108.28 mg/dL    Comment: Performed at [facility], [place] [address], [place], [place] [address]  Hepatic function panel     Status: Abnormal   Collection Time: 04/17/22  6:22 AM  Result Value Ref Range   Total Protein 7.7 6.5 - 8.1 g/dL   Albumin 3.8 3.5 - 5.0 g/dL   AST 157 (H) 15 - 41 U/L   ALT 217 (H) 0 - 44 U/L   Alkaline Phosphatase 41 38 - 126 U/L   Total Bilirubin 0.6 0.3 - 1.2 mg/dL   Bilirubin, Direct <0.1 0.0 - 0.2 mg/dL   Indirect Bilirubin NOT CALCULATED 0.3 - 0.9 mg/dL    Comment: Performed at [facility], [place] [address], [place], [place] [address]  Lipid panel     Status: Abnormal   Collection Time: 04/17/22  6:22 AM  Result Value Ref Range   Cholesterol 183 0 -  200 mg/dL   Triglycerides 56 <150 mg/dL   HDL 72 >40 mg/dL   Total CHOL/HDL Ratio 2.5 RATIO   VLDL 11 0 - 40 mg/dL   LDL Cholesterol 100 (H) 0 - 99 mg/dL    Comment:        Total Cholesterol/HDL:CHD Risk Coronary Heart Disease Risk Table                     Men   Women  1/2 Average Risk   3.4   3.3  Average Risk       5.0   4.4  2 X Average Risk   9.6   7.1  3 X Average Risk  23.4   11.0        Use the calculated Patient Ratio above and the CHD Risk Table to determine the patient's CHD Risk.        ATP III CLASSIFICATION (LDL):  <100     mg/dL   Optimal  100-129  mg/dL   Near or Above                    Optimal  130-159  mg/dL   Borderline  160-189  mg/dL   High  >190     mg/dL   Very High Performed at [facility] [address] [place], [place] [address]    Blood Alcohol level:  Lab Results  Component Value Date   ETH 69 (H) 04/10/2022   ETH <10 53/29/9242   Metabolic Disorder Labs: Lab Results  Component Value Date   HGBA1C 5.4 04/17/2022   MPG 108.28 04/17/2022   MPG 108.28 06/14/2020   No results found for: "PROLACTIN" Lab Results  Component Value Date   CHOL 183 04/17/2022   TRIG 56 04/17/2022   HDL 72 04/17/2022   CHOLHDL 2.5 04/17/2022  VLDL 11 04/17/2022   LDLCALC 100 (H) 04/17/2022   LDLCALC 103 (H) 06/14/2020   Physical Findings: AIMS:  , ,  ,  ,    CIWA:  CIWA-Ar Total: 1 COWS:     Musculoskeletal: Strength & Muscle Tone: within normal limits Gait & Station: normal Patient leans: N/A  Psychiatric Specialty Exam:  Presentation  General Appearance:  Appropriate for Environment; Fairly Groomed  Eye Contact: Good  Speech: Clear and Coherent  Speech Volume: Normal  Handedness: Right   Mood and Affect  Mood: Euthymic  Affect: Congruent   Thought Process  Thought Processes: Coherent  Descriptions of Associations:Intact  Orientation:Full (Time, Place and Person)  Thought  Content:Logical  History of Schizophrenia/Schizoaffective disorder:No data recorded Duration of Psychotic Symptoms:No data recorded Hallucinations:Hallucinations: None  Ideas of Reference:None  Suicidal Thoughts:Suicidal Thoughts: No  Homicidal Thoughts:Homicidal Thoughts: No    Sensorium  Memory: Immediate Good  Judgment: Good  Insight: Good   Executive Functions  Concentration: Good  Attention Span: Good  Recall: Good  Fund of Knowledge: Good  Language: Good   Psychomotor Activity  Psychomotor Activity: Psychomotor Activity: Normal   Assets  Assets: Communication Skills   Sleep  Sleep: Sleep: Good  Physical Exam: Physical Exam Vitals and nursing note reviewed.  HENT:     Head: Normocephalic.     Nose: Nose normal.     Mouth/Throat:     Mouth: Mucous membranes are moist.     Pharynx: Oropharynx is clear.  Eyes:     Pupils: Pupils are equal, round, and reactive to light.  Cardiovascular:     Rate and Rhythm: Normal rate.     Pulses: Normal pulses.  Pulmonary:     Effort: Pulmonary effort is normal.  Abdominal:     Palpations: Abdomen is soft.  Musculoskeletal:        General: Normal range of motion.     Cervical back: Normal range of motion.  Skin:    General: Skin is warm and dry.  Neurological:     Mental Status: She is alert and oriented to person, place, and time. Mental status is at baseline.  Psychiatric:        Attention and Perception: Attention and perception normal.        Mood and Affect: Mood and affect normal.        Speech: Speech normal.        Behavior: Behavior normal. Behavior is cooperative.        Thought Content: Thought content normal.        Cognition and Memory: Cognition and memory normal.        Judgment: Judgment normal.    Review of Systems  HENT: Negative.    Eyes: Negative.   Respiratory: Negative.    Cardiovascular: Negative.   Gastrointestinal: Negative.   Genitourinary: Negative.    Musculoskeletal: Negative.   Skin: Negative.   Neurological: Negative.   Psychiatric/Behavioral:  Positive for depression and substance abuse. Negative for hallucinations and memory loss. The patient is nervous/anxious and has insomnia.   All other systems reviewed and are negative.  Blood pressure (!) 118/91, pulse (!) 101, temperature 98.2 F (36.8 C), temperature source Oral, resp. rate 16, height 5\' 4"  (1.626 m), weight 49 kg, SpO2 100 %. Body mass index is 18.54 kg/m.  Treatment Plan Summary: Daily contact with patient to assess and evaluate symptoms and progress in treatment and Medication management  Observation Level/Precautions:  15 minute checks  Laboratory:  Labs reviewed  Psychotherapy:  Unit Group sessions  Medications:  See [facility]  Consultations:  To be determined   Discharge Concerns:  Safety, medication compliance, mood stability  Estimated LOS: 5-7 days  Other:  N/A   Labs reviewed 04/16/2022: Lipid panel, repeat hepatic function panel, hemoglobin A1c, to be drawn tomorrow morning.  Toxicology screen positive for opioids, positive for THC.  Patient denies history of opioid abuse, it is possible that opioids were given in the ER prior to urine being collected.  She is being treated for a UTI as her UA reflects this.  She denies symptoms at this time.  Vitamin B 12 WNL, B1 WNL, TSH WNL, CMP from 1/27 reflects potassium level of 3.1, rechecked and within normal limits.  CBC reviewed.  QTc 448.  PLAN Safety and Monitoring: Voluntary admission to inpatient psychiatric unit for safety, stabilization and treatment Daily contact with patient to assess and evaluate symptoms and progress in treatment Patient's case to be discussed in multi-disciplinary team meeting Observation Level : q15 minute checks Vital signs: q12 hours Precautions: Safety  Long Term Goal(s): Improvement in symptoms so as ready for discharge  Short Term Goals: Ability to identify changes in lifestyle to  reduce recurrence of condition will improve, Ability to verbalize feelings will improve, Ability to disclose and discuss suicidal ideas, Ability to identify and develop effective coping behaviors will improve, Ability to maintain clinical measurements within normal limits will improve, Compliance with prescribed medications will improve, and Ability to identify triggers associated with substance abuse/mental health issues will improve  Diagnoses:  Principal Problem:   Bipolar 1 disorder, depressed ([facility]) Active Problems:   Alcohol use disorder   Anxiety   Insomnia   Delta-9-tetrahydrocannabinol (THC) dependence ([facility])  Medications -Continue Vraylar 3 mg for mood stabilization -Continue trazodone 100 mg nightly for insomnia -Continue Melatonin 5 mg nightly for insomnia -Continue PRN trazodone 50 mg nightly for sleep -Continue Miralax daily for constipation -Continue Hydroxyzine 25 mg every 6 hours PRN -Continue Sucralfate  1 g TID to prevent GI upset prior to meals -Continue Nicorette gum PRN for nicotine addiction  Alcohol use d/o -Continue Ativan detox protocol -Monitor for signs of withdrawal - Oral thiamine and MVI replacement as per MAR - Abstinence from substances encouraged - SW to look into options for outpatient SA treatment at discharge   Antibiotics prescribed by medical team for reasons as follows: As per Dr Geraldo Pitter, [facility]. M.D (04/12/2022), "Patient was recently seen on 03/28/22 for UA showed small leukocytes, trace blood, negative nitrites. Urine culture showed mixed urogenital flora <10K colonies. Per student health, patient is also positive for BV and chlamydia. She has not been taking these medications. Will continue treatment given her symptoms. Patient amenable to further STI testing. RPR negative. Her contraception is Depo-Provera injections, next dose in February. Given concern for antibiotics affecting pharmacokinetics of contraception, will advise patient to use second form  of contraception at discharge. -Continue doxycycline 100mg  x7 days (day 2), metronidazole 500mg  x7 days (day 2), nitrofurantoin 100mg  x5 days (day 2) -Pending GC/Chlamydia-ordered -Pending urine pregnancy-ordered  -Continue doxycycline 100 mg x 7 days -End date 1/31@ 0800 -Continue Flagyl 500 mg x 7 days, then stop-01/31 -Continue Nitrofurantoin 100 mg x 5 days, then stop-ended 1/29 @ 0800  Other PRNS -Continue Tylenol 650 mg every 6 hours PRN for mild pain -Continue Maalox 30 mg every 4 hrs PRN for indigestion -Continue Imodium 2-4 mg as needed for diarrhea -Continue Milk of Magnesia as needed every 6 hrs for constipation -  Continue Zofran disintegrating tabs every 6 hrs PRN for nausea   Discharge Planning: Social work and case management to assist with discharge planning and identification of hospital follow-up needs prior to discharge Estimated LOS: 5-7 days Discharge Concerns: Need to establish a safety plan; Medication compliance and effectiveness Discharge Goals: Return home with outpatient referrals for mental health follow-up including medication management/psychotherapy  I certify that inpatient services furnished can reasonably be expected to improve the patient's condition.    Starleen Blue, [place] 1/31/20241:44 PM Patient ID: Enedina Pair, female   DOB: 11-Dec-2000, 22 y.o.   MRN: [id number] Patient ID: Amanda Steuart, female   DOB: [birth year], 22 y.o.   MRN: [id number]

## 2022-04-18 NOTE — B[facility] Group Notes (Signed)
Adult Psychoeducational Group Note  Date:  04/18/2022 Time:  5:29 PM  Group Topic/Focus:  BING: Helps to improve memory and other cognitive skills, as well as strengthen focus and concentration.  Additional Comments:  Patient was cooperative and social during group.   Rosendo Couser T Ria Comment 04/18/2022, 5:29 PM

## 2022-04-18 NOTE — Progress Notes (Signed)
   04/18/22 1100  Psych Admission Type (Psych Patients Only)  Admission Status Involuntary  Psychosocial Assessment  Patient Complaints None  Eye Contact Fair  Facial Expression Anxious  Affect Appropriate to circumstance  Speech Logical/coherent  Interaction Assertive  Motor Activity Other (Comment)  Appearance/Hygiene Unremarkable  Behavior Characteristics Cooperative  Mood Pleasant  Thought Process  Coherency WDL  Content WDL  Delusions None reported or observed  Perception WDL  Hallucination None reported or observed  Judgment Impaired  Confusion None  Danger to Self  Current suicidal ideation? Denies  Danger to Others  Danger to Others None reported or observed

## 2022-04-18 NOTE — Progress Notes (Signed)
   04/17/22 2100  Psych Admission Type (Psych Patients Only)  Admission Status Involuntary  Psychosocial Assessment  Patient Complaints Anxiety  Eye Contact Fair  Facial Expression Anxious  Affect Appropriate to circumstance  Speech Logical/coherent  Interaction Assertive  Motor Activity Other (Comment) (WDL)  Appearance/Hygiene Unremarkable  Behavior Characteristics Cooperative  Mood Anxious;Pleasant  Thought Process  Coherency WDL  Content WDL  Delusions None reported or observed  Perception WDL  Hallucination None reported or observed  Judgment Impaired  Confusion None  Danger to Self  Current suicidal ideation? Denies  Danger to Others  Danger to Others None reported or observed   Patient alert and oriented. Presenting appropriate to circumstance with an anxious, pleasant mood. Patient denies SI, HI, AVH. Patient endorses anxiety 6/10 and depression 0/10. Patient endorses pain 7/10 on the right side of her abdomen, described as "crampy". Scheduled medications administered to patient, per provider orders. Support and encouragement provided. Routine safety checks conducted every 15 minutes. Patient verbally contracts for safety and remains safe on the unit.

## 2022-04-19 DIAGNOSIS — F319 Bipolar disorder, unspecified: Secondary | ICD-10-CM | POA: Diagnosis not present

## 2022-04-19 LAB — BETA HCG QUANT (REF LAB): hCG Quant: <1 m[IU]/mL

## 2022-04-19 MED ORDER — CARIPRAZINE HCL 3 MG PO CAPS: 3.0000 mg | ORAL_CAPSULE | Freq: Every day | ORAL | 0 refills | Status: AC

## 2022-04-19 MED ORDER — TRAZODONE HCL 100 MG PO TABS: 100.0000 mg | ORAL_TABLET | Freq: Every day | ORAL | 0 refills | Status: AC

## 2022-04-19 MED ORDER — HYDROXYZINE HCL 25 MG PO TABS: 25.0000 mg | ORAL_TABLET | Freq: Three times a day (TID) | ORAL | 0 refills | Status: AC | PRN

## 2022-04-19 MED ORDER — NICOTINE POLACRILEX 2 MG MT GUM: 2.0000 mg | CHEWING_GUM | OROMUCOSAL | 0 refills | Status: AC | PRN

## 2022-04-19 NOTE — Progress Notes (Signed)
D:  Patient denied SI and HI, contracts for safety.  Denied A/V hallucinations.  Denied pain. A:  Medications administered per MD orders.  Emotional support and encouragement given patient. R:  Safety maintained with 15 minute checks.  

## 2022-04-19 NOTE — Discharge Instructions (Signed)
-  Follow-up with your outpatient psychiatric provider -instructions on appointment date, time, and address (location) are provided to you in discharge paperwork.  -Take your psychiatric medications as prescribed at discharge - instructions are provided to you in the discharge paperwork  -Follow-up with outpatient primary care doctor and other specialists -for management of preventative medicine and any chronic medical disease.  -Recommend abstinence from alcohol, tobacco, and other illicit drug use at discharge.   -If your psychiatric symptoms recur, worsen, or if you have side effects to your psychiatric medications, call your outpatient psychiatric provider, 911, [phone] or go to the nearest emergency department.  -If suicidal thoughts occur, call your outpatient psychiatric provider, 911, 988 or go to the nearest emergency department.  Naloxone (Narcan) can help reverse an overdose when given to the victim quickly.  [place] offers free naloxone kits and instructions/training on its use.  Add naloxone to your first aid kit and you can help save a life.   Pick up your free kit at the following locations:   [place]:  [facility], [address] [place] [address] ([phone]) [facility] [address] [place] [id number] ([phone]) [facility] [address] [place] [address]  [place]: [facility] [address] [place] [address] ([phone]) [facility] [address] [place] [address] ([phone])  

## 2022-04-19 NOTE — Group Note (Signed)
Date:  04/19/2022 Time:  10:29 AM  Group Topic/Focus:  Orientation:   The focus of this group is to educate the patient on the purpose and policies of crisis stabilization and provide a format to answer questions about their admission.  The group details unit policies and expectations of patients while admitted.    Participation Level:  Active  Participation Quality:  Appropriate  Affect:  Appropriate  Cognitive:  Appropriate  Insight: Appropriate  Engagement in Group:  Engaged  Modes of Intervention:  Discussion  Additional Comments:     Jerrye Beavers 04/19/2022, 10:29 AM

## 2022-04-19 NOTE — Progress Notes (Signed)
   04/19/22 0554  15 Minute Checks  Location Bedroom  Visual Appearance Calm  Behavior Sleeping  Sleep (Behavioral Health Patients Only)  Calculate sleep? (Click Yes once per 24 hr at 0600 safety check) Yes  Documented sleep last 24 hours 7

## 2022-04-19 NOTE — [facility] Suicide Risk Assessment (Signed)
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 She reported drinking 2 pints of rum night before with persistent nausea and vomiting. She was treated medically for Alcohol Ketoacidosis, alcohol gastroduodenitis, hypokalemia, heaptosteatosis, LFT elevation with concerns for unspecified mood disorder. Patient was assessed by psychiatric service while in hospital and referred for inpatient hospitalization.     During the patient's hospitalization, patient had extensive initial psychiatric evaluation, and follow-up psychiatric evaluations every day.   Psychiatric diagnoses provided upon initial assessment:  MDD dx was revised to bipolar disorder type 1 Alcohol use disorder   Patient's medications were adjusted on admission:   Continue:              - Aripiprazole 2 mg daily for  depression             - Doxycycline 100 mg q12 hrs for  Infection              - CIWA Protocol: Folic Acid 1 mg    daily, multivitamin 1 tab daily, Thiamine 100 mg daily (PO or IM) - Lorazepam Taper: Lorazepam 1  mg TID day #1, Lorazepam 1 mg  BID day#2, Lorazepam 0.5 mg BID day #3 - Metronidazole 500 mg q 12 hrs - Nicoderm patch 14 mg daily - Nitrofurantoin 100 mg q12 hrs  Discontinue: lamotrigine   During the hospitalization, other adjustments were made to the patient's psychiatric  medication regimen:  -abilify was stopped -vraylar was started and titrated to 3 mg once daily with food -pt completed course of: doxycycline, metronidazole, nitrofurantoin  -pt completed ativan taper  -pt declined natlrexone    Patient's care was discussed during the interdisciplinary team meeting every day during the hospitalization.   The patient denied having side effects to prescribed psychiatric medication.   Gradually, patient started adjusting to milieu. The patient was evaluated each day by a clinical provider to ascertain response to treatment. Improvement was noted by the patient's report of decreasing symptoms, improved sleep and appetite, affect, medication tolerance, behavior, and participation in unit programming.  Patient was asked each day to complete a self inventory noting mood, mental status, pain, new symptoms, anxiety and concerns.     Symptoms were reported as significantly decreased or resolved completely by discharge.    On day of discharge, the patient reports that their mood is stable. The patient denied having suicidal thoughts for more than 48 hours prior to discharge.  Patient denies having homicidal thoughts.  Patient denies having auditory hallucinations.  Patient denies any visual hallucinations or other symptoms of psychosis. The patient was motivated to continue taking medication with a goal of continued improvement in mental health.    The patient reports their target psychiatric symptoms of depression, suicidal thoughts, alcohol w/d symptoms and alcohol craving symptoms, all responded well to the psychiatric medications, and the patient reports overall benefit other psychiatric hospitalization. Supportive psychotherapy was provided to the patient. The  patient also participated in regular group therapy while hospitalized. Coping skills, problem solving as well as relaxation therapies were also part of the unit programming.   Labs were reviewed with the patient,  and abnormal results were discussed with the patient.   The patient is able to verbalize their individual safety plan to this provider.   # It is recommended to the patient to continue psychiatric medications as prescribed, after discharge from the hospital.     # It is recommended to the patient to follow up with your outpatient psychiatric provider and PCP.   # It was discussed with the patient, the impact of alcohol, drugs, tobacco have been there overall psychiatric and medical wellbeing, and total abstinence from substance use was recommended the patient.ed.   # Prescriptions provided or sent directly to preferred pharmacy at discharge. Patient agreeable to plan. Given opportunity to ask questions. Appears to feel comfortable with discharge.    # In the event of worsening symptoms, the patient is instructed to call the crisis hotline, 911 and or go to the nearest ED for appropriate evaluation and treatment of symptoms. To follow-up with primary care provider for other medical issues, concerns and or health care needs   # Patient was discharged to care of friend, with a plan to follow up as noted below.    Psychiatric Specialty Exam  Presentation  General Appearance:  Appropriate for Environment; Casual; Fairly Groomed  Eye Contact: Good  Speech: Normal Rate; Clear and Coherent  Speech Volume: Normal  Handedness: Right   Mood and Affect  Mood: Euthymic  Duration of Depression Symptoms: No data recorded Affect: Appropriate; Congruent; Full Range   Thought Process  Thought Processes: Linear  Descriptions of Associations:Intact  Orientation:Full (Time, Place and Person)  Thought Content:Logical  History of Schizophrenia/Schizoaffective disorder:No data recorded Duration of Psychotic Symptoms:No data recorded Hallucinations:Hallucinations: None  Ideas of Reference:None  Suicidal Thoughts:Suicidal Thoughts: No  Homicidal Thoughts:Homicidal Thoughts:  No   Sensorium  Memory: Immediate Good; Recent Good; Remote Good  Judgment: Good  Insight: Good   Executive Functions  Concentration: Good  Attention Span: Good  Recall: Good  Fund of Knowledge: Good  Language: Good   Psychomotor Activity  Psychomotor Activity: Psychomotor Activity: Normal   Assets  Assets: Communication Skills   Sleep  Sleep: Sleep: Fair   Physical Exam: Physical Exam See discharge summary  ROS See discharge summary  Blood pressure 131/84, pulse 83, temperature 98.2 F (36.8 C), temperature source Oral, resp. rate 20, height 5\' 4"  (1.626 m), weight 49 kg, SpO2 100 %. Body mass index is 18.54 kg/m.  Mental Status Per Nursing Assessment::   On Admission:  NA Demographic factors:  Adolescent or young adult Loss Factors:  NA Historical Factors:  NA Risk Reduction Factors:  Living with another person, especially a relative  Continued Clinical Symptoms:  Bipolar d/o - mood is stable, denying si, hi. Denying psychotic symptoms. Sleeping well.   Cognitive Features That Contribute To Risk:  None    Suicide Risk:  Mild:  There are no identifiable suicide plans, no associated intent, mild dysphoria and related symptoms, good self-control (both objective and subjective assessment), few other risk factors, and identifiable protective factors, including available and accessible social support.    [facility]. Call.   Why: Please call this provider personally to do a pre-screening [phone], and to set up appointments for therapy and medication management services. Contact information: [address], [address], [place], [place] [address]  Phone: [phone], [phone]                Plan Of Care/Follow-up recommendations:    Activity: as tolerated   Diet: heart healthy   Other: -Follow-up with your outpatient psychiatric provider -instructions on appointment date, time, and  address (location) are provided to you in discharge paperwork.   -Take your psychiatric medications as prescribed at discharge - instructions are provided to you in the discharge paperwork   -Follow-up with outpatient primary care doctor and other specialists -for management of preventative medicine and chronic medical disease, including - f/u with PCP for post-hospital discharge follow-up    -Testing: Follow-up with outpatient provider for abnormal lab results:  04-16-2022: AST 157 (downtrending)  ALT 217 (downtrending)      -Recommend abstinence from alcohol, tobacco, and other illicit drug use at discharge.    -If your psychiatric symptoms recur, worsen, or if you have side effects to your psychiatric medications, call your outpatient psychiatric provider, 911, 988 or go to the nearest emergency department.   -If suicidal thoughts recur, call your outpatient psychiatric provider, 911, 988 or go to the nearest emergency department.   Christoper Allegra, MD 04/19/2022, 9:53 AM

## 2022-04-19 NOTE — Progress Notes (Signed)
Patient discharged from [facility] on 04/19/22. Patient denies SI, plan, and intention. Suicide safety plan completed, reviewed with this RN, given to the patient, and a copy in the chart. Patient denies HI/AVH upon discharge. Patient rates her depression a 0/10 and her anxiety a 0/10. Patient is alert, oriented, and cooperative. RN provided patient with discharge paperwork and reviewed information with patient. Patient expressed that she understood all of the discharge instructions. Pt was satisfied with belongings returned to her from the locker and at bedside. Discharged patient to [facility] waiting room.  

## 2022-04-19 NOTE — Plan of Care (Signed)
Nurse discussed coping skills with patient.  

## 2022-04-19 NOTE — Discharge Summary (Signed)
Physician Discharge Summary Note  Patient:  Darlene King is an 22 y.o., female MRN:  [id number] DOB:  [birth year] Patient phone:  [phone] (home)  Patient address:   [address] 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 She reported drinking 2 pints of rum night before with persistent nausea and vomiting. She was treated medically for Alcohol Ketoacidosis, alcohol gastroduodenitis, hypokalemia, heaptosteatosis, LFT elevation with concerns for unspecified mood disorder. Patient was assessed by psychiatric service while in hospital and referred for inpatient hospitalization.      Principal Problem: Bipolar 1 disorder, depressed ([place]) Discharge Diagnoses: Principal Problem:   Bipolar 1 disorder, depressed ([place]) Active Problems:   Alcohol use disorder   Anxiety   Insomnia   Delta-9-tetrahydrocannabinol (THC) dependence ([place])   Past Psychiatric History:  [place] 05/2020 (following pregnancy termination);  Outpatient therapy and medication management: [facility] since 12/2021 in [place]; prescribed Vraylar, Quetiapine, Lamotrigine, Trazodone  Prior Inpatient Therapy: No. If yes, describe; [facility] 05/2020  Prior Outpatient Therapy: Yes.   If yes, describe outpatient services in [place], [place]; [facility]    Past Medical History: History reviewed. No pertinent past medical history.  Past Surgical History:  Procedure Laterality Date   TONSILLECTOMY     Family History:  Family History  Problem Relation Age of Onset    Prostate cancer Paternal Grandfather    Family Psychiatric  History:  substance abuse, bipolar (m) grandfather; bipolar (m) uncle, 1st cousin (his daughter). All have been hospitalized. Mother- anxiety, sister- BPD, anxiety, ADHD, OCD, bipolar    Social History:  Social History   Substance and Sexual Activity  Alcohol Use Yes     Social History   Substance and Sexual Activity  Drug Use Yes   Types: Marijuana    Social History   Socioeconomic History   Marital status: Single    Spouse name: Not on file   Number of children: Not on file   Years of education: Not on file   Highest education level: Not on file  Occupational History   Not on file  Tobacco Use   Smoking status: Every Day   Smokeless tobacco: Never  Vaping Use   Vaping Use: Every day   Substances: Nicotine  Substance and Sexual Activity   Alcohol use: Yes   Drug use: Yes    Types: Marijuana   Sexual activity: Not on file  Other Topics Concern   Not on file  Social History Narrative   Not on file   Social Determinants of Health   Financial Resource Strain: Not on file  Food Insecurity: No Food Insecurity (04/13/2022)   Hunger Vital Sign    Worried About Running Out of Food in the Last Year: Never true    Ran Out of Food in the Last Year: Never true  Transportation Needs: No Transportation Needs (04/13/2022)   PRAPARE - Hydrologist (Medical): No    Lack of Transportation (Non-Medical): No  Recent Concern: Transportation Needs - Unmet Transportation Needs (04/11/2022)   PRAPARE - Transportation  Lack of Transportation (Medical): Yes    Lack of Transportation (Non-Medical): Yes  Physical Activity: Not on file  Stress: Not on file  Social Connections: Not on file    Hospital Course:    During the patient's hospitalization, patient had extensive initial psychiatric evaluation, and follow-up psychiatric evaluations every day.  Psychiatric diagnoses provided upon  initial assessment:  MDD dx was revised to bipolar disorder type 1 Alcohol use disorder  Patient's medications were adjusted on admission:   Continue:              - Aripiprazole 2 mg daily for  depression             - Doxycycline 100 mg q12 hrs for  Infection              - CIWA Protocol: Folic Acid 1 mg    daily, multivitamin 1 tab daily, Thiamine 100 mg daily (PO or IM) - Lorazepam Taper: Lorazepam 1  mg TID day #1, Lorazepam 1 mg  BID day#2, Lorazepam 0.5 mg BID day #3 - Metronidazole 500 mg q 12 hrs - Nicoderm patch 14 mg daily - Nitrofurantoin 100 mg q12 hrs  Discontinue: lamotrigine  During the hospitalization, other adjustments were made to the patient's psychiatric medication regimen:  -abilify was stopped -vraylar was started and titrated to 3 mg once daily with food -pt completed course of: doxycycline, metronidazole, nitrofurantoin  -pt completed ativan taper  -pt declined natlrexone   Patient's care was discussed during the interdisciplinary team meeting every day during the hospitalization.  The patient denied having side effects to prescribed psychiatric medication.  Gradually, patient started adjusting to milieu. The patient was evaluated each day by a clinical provider to ascertain response to treatment. Improvement was noted by the patient's report of decreasing symptoms, improved sleep and appetite, affect, medication tolerance, behavior, and participation in unit programming.  Patient was asked each day to complete a self inventory noting mood, mental status, pain, new symptoms, anxiety and concerns.    Symptoms were reported as significantly decreased or resolved completely by discharge.   On day of discharge, the patient reports that their mood is stable. The patient denied having suicidal thoughts for more than 48 hours prior to discharge.  Patient denies having homicidal thoughts.  Patient denies having auditory hallucinations.  Patient denies any visual  hallucinations or other symptoms of psychosis. The patient was motivated to continue taking medication with a goal of continued improvement in mental health.   The patient reports their target psychiatric symptoms of depression, suicidal thoughts, alcohol w/d symptoms and alcohol craving symptoms, all responded well to the psychiatric medications, and the patient reports overall benefit other psychiatric hospitalization. Supportive psychotherapy was provided to the patient. The patient also participated in regular group therapy while hospitalized. Coping skills, problem solving as well as relaxation therapies were also part of the unit programming.  Labs were reviewed with the patient, and abnormal results were discussed with the patient.  The patient is able to verbalize their individual safety plan to this provider.  # It is recommended to the patient to continue psychiatric medications as prescribed, after discharge from the hospital.    # It is recommended to the patient to follow up with your outpatient psychiatric provider and PCP.  # It was discussed with the patient, the impact of alcohol, drugs, tobacco have been there overall psychiatric and medical wellbeing, and total abstinence from substance use was recommended the patient.ed.  # Prescriptions  provided or sent directly to preferred pharmacy at discharge. Patient agreeable to plan. Given opportunity to ask questions. Appears to feel comfortable with discharge.    # In the event of worsening symptoms, the patient is instructed to call the crisis hotline, 911 and or go to the nearest ED for appropriate evaluation and treatment of symptoms. To follow-up with primary care provider for other medical issues, concerns and or health care needs  # Patient was discharged to care of friend, with a plan to follow up as noted below.    Physical Findings: AIMS:  , ,  ,  ,    CIWA:  CIWA-Ar Total: 1 COWS:     Aims score zero on my exam. No  eps on my exam.     Musculoskeletal: Strength & Muscle Tone: within normal limits Gait & Station: normal Patient leans: N/A   Psychiatric Specialty Exam:  Presentation  General Appearance:  Appropriate for Environment; Casual; Fairly Groomed  Eye Contact: Good  Speech: Normal Rate; Clear and Coherent  Speech Volume: Normal  Handedness: Right   Mood and Affect  Mood: Euthymic  Affect: Appropriate; Congruent; Full Range   Thought Process  Thought Processes: Linear  Descriptions of Associations:Intact  Orientation:Full (Time, Place and Person)  Thought Content:Logical  History of Schizophrenia/Schizoaffective disorder:No data recorded Duration of Psychotic Symptoms:No data recorded Hallucinations:Hallucinations: None  Ideas of Reference:None  Suicidal Thoughts:Suicidal Thoughts: No  Homicidal Thoughts:Homicidal Thoughts: No   Sensorium  Memory: Immediate Good; Recent Good; Remote Good  Judgment: Good  Insight: Good   Executive Functions  Concentration: Good  Attention Span: Good  Recall: Good  Fund of Knowledge: Good  Language: Good   Psychomotor Activity  Psychomotor Activity: Psychomotor Activity: Normal   Assets  Assets: Communication Skills   Sleep  Sleep: Sleep: Fair    Physical Exam: Physical Exam Vitals reviewed.  Constitutional:      General: She is not in acute distress.    Appearance: She is normal weight. She is not toxic-appearing.  Neurological:     Mental Status: She is alert.     Motor: No weakness.     Gait: Gait normal.  Psychiatric:        Mood and Affect: Mood normal.        Behavior: Behavior normal.        Thought Content: Thought content normal.        Judgment: Judgment normal.    Review of Systems  Constitutional:  Negative for chills and fever.  Cardiovascular:  Negative for chest pain and palpitations.  Neurological:  Negative for dizziness, tingling, tremors and  headaches.  Psychiatric/Behavioral:  Negative for depression, hallucinations, memory loss, substance abuse and suicidal ideas. The patient is not nervous/anxious and does not have insomnia.   All other systems reviewed and are negative.  Blood pressure 131/84, pulse 83, temperature 98.2 F (36.8 C), temperature source Oral, resp. rate 20, height 5\' 4"  (1.626 m), weight 49 kg, SpO2 100 %. Body mass index is 18.54 kg/m.   Social History   Tobacco Use  Smoking Status Every Day  Smokeless Tobacco Never   Tobacco Cessation:  A prescription for an FDA-approved tobacco cessation medication provided at discharge   Blood Alcohol level:  Lab Results  Component Value Date   ETH 69 (H) 04/10/2022   ETH <10 05/39/7673    Metabolic Disorder Labs:  Lab Results  Component Value Date   HGBA1C 5.4 04/17/2022   MPG 108.28 04/17/2022   MPG  108.28 06/14/2020   No results found for: "PROLACTIN" Lab Results  Component Value Date   CHOL 183 04/17/2022   TRIG 56 04/17/2022   HDL 72 04/17/2022   CHOLHDL 2.5 04/17/2022   VLDL 11 04/17/2022   LDLCALC 100 (H) 04/17/2022   LDLCALC 103 (H) 06/14/2020    See Psychiatric Specialty Exam and Suicide Risk Assessment completed by Attending Physician prior to discharge.  Discharge destination:  Home  Is patient on multiple antipsychotic therapies at discharge:  No   Has Patient had three or more failed trials of antipsychotic monotherapy by history:  No  Recommended Plan for Multiple Antipsychotic Therapies: NA  Discharge Instructions     Diet - low sodium heart healthy   Complete by: As directed    Increase activity slowly   Complete by: As directed       Allergies as of 04/19/2022   No Known Allergies      Medication List     STOP taking these medications    doxycycline 100 MG tablet Commonly known as: VIBRA-TABS   lamoTRIgine Starter Kit-Orange 42 x 25 MG & 7 x 100 MG Kit   metroNIDAZOLE 500 MG tablet Commonly known as:  FLAGYL   naltrexone 50 MG tablet Commonly known as: DEPADE       TAKE these medications      Indication  cariprazine 3 MG capsule Commonly known as: VRAYLAR Take 1 capsule (3 mg total) by mouth daily. Start taking on: April 20, 2022 What changed:  medication strength how much to take  Indication: MIXED BIPOLAR AFFECTIVE DISORDER   hydrOXYzine 25 MG tablet Commonly known as: ATARAX Take 1 tablet (25 mg total) by mouth 3 (three) times daily as needed for anxiety.  Indication: Feeling Anxious   medroxyPROGESTERone 150 MG/ML injection Commonly known as: DEPO-PROVERA Inject 150 mg into the muscle every 3 (three) months.  Indication: Birth Control Treatment   nicotine polacrilex 2 MG gum Commonly known as: NICORETTE Take 1 each (2 mg total) by mouth as needed for smoking cessation.  Indication: Nicotine Addiction   traZODone 100 MG tablet Commonly known as: DESYREL Take 1 tablet (100 mg total) by mouth at bedtime. What changed:  medication strength how much to take when to take this reasons to take this  Indication: Trouble Sleeping        Follow-up Information     [facility]. Call.   Why: Please call this provider personally to do a pre-screening [phone], and to set up appointments for therapy and medication management services. Contact information: [address], [address], [place], [place] [address]  Phone: [phone], [phone]                Follow-up recommendations:    Activity: as tolerated  Diet: heart healthy  Other: -Follow-up with your outpatient psychiatric provider -instructions on appointment date, time, and address (location) are provided to you in discharge paperwork.  -Take your psychiatric medications as prescribed at discharge - instructions are provided to you in the discharge paperwork  -Follow-up with outpatient primary care doctor and other specialists -for management of preventative medicine and  chronic medical disease, including - f/u with PCP for post-hospital discharge follow-up   -Testing: Follow-up with outpatient provider for abnormal lab results:  04-16-2022: AST 157 (downtrending)  ALT 217 (downtrending)    -Recommend abstinence from alcohol, tobacco, and other illicit drug use at discharge.   -If your psychiatric symptoms recur, worsen, or if you have side  effects to your psychiatric medications, call your outpatient psychiatric provider, 911, 988 or go to the nearest emergency department.  -If suicidal thoughts recur, call your outpatient psychiatric provider, 911, 988 or go to the nearest emergency department.   Signed: Christoper Allegra, MD 04/19/2022, 9:46 AM   Total Time Spent in Direct Patient Care:  I personally spent 35 minutes on the unit in direct patient care. The direct patient care time included face-to-face time with the patient, reviewing the patient's chart, communicating with other professionals, and coordinating care. Greater than 50% of this time was spent in counseling or coordinating care with the patient regarding goals of hospitalization, psycho-education, and discharge planning needs.   Janine Limbo, MD Psychiatrist

## 2022-04-19 NOTE — Progress Notes (Signed)
  [facility] Adult Case Management Discharge Plan :  Will you be returning to the same living situation after discharge:  Yes,  school At discharge, do you have transportation home?: Yes,  friend will be picking patient up Do you have the ability to pay for your medications: Yes,  insurance   Release of information consent forms completed and in the chart;  Patient's signature needed at discharge.  Patient to Follow up at:  [facility]. Call.   Why: Please call this provider personally to do a pre-screening [phone], and to set up appointments for therapy and medication management services. Contact information: [address], [place], [place], [place] [address]  Phone: [phone], [phone]                Next level of care provider has access to [place] and Suicide Prevention discussed: Yes,  with patient , patient declined consents for community/social support     Has patient been referred to the Quitline?: Patient refused referral  Patient has been referred for addiction treatment: Pt. refused referral  [place], LCSW 04/19/2022, 10:17 AM
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
## 2022-07-29 ENCOUNTER — Encounter (HOSPITAL_COMMUNITY): Payer: Self-pay

## 2022-07-29 ENCOUNTER — Ambulatory Visit (HOSPITAL_COMMUNITY)
Admission: EM | Admit: 2022-07-29 | Discharge: 2022-07-29 | Disposition: A | Discharge status: Home / Self Care | Payer: 59 | Attending: Emergency Medicine | Admitting: Emergency Medicine

## 2022-07-29 DIAGNOSIS — N898 Other specified noninflammatory disorders of vagina: Secondary | ICD-10-CM | POA: Insufficient documentation

## 2022-07-29 LAB — POCT URINALYSIS DIP (MANUAL ENTRY)
Bilirubin, UA: NEGATIVE
Glucose, UA: NEGATIVE mg/dL
Ketones, POC UA: NEGATIVE mg/dL
Nitrite, UA: NEGATIVE
Spec Grav, UA: >=1.03 — AB (ref 1.010–1.025)
Urobilinogen, UA: 0.2 E.U./dL
pH, UA: 5.5 (ref 5.0–8.0)

## 2022-07-29 MED ORDER — FLUCONAZOLE 150 MG PO TABS: 150.0000 mg | ORAL_TABLET | Freq: Every day | ORAL | 0 refills | Status: AC

## 2022-07-29 NOTE — ED Triage Notes (Signed)
Patient here today with c/o vaginal itching and urinary discomfort X 4-5 days. She has also noticed that when she wipes, she will see blood on the tissue. Has not tried anything.

## 2022-07-29 NOTE — ED Provider Notes (Signed)
[facility]    CSN: [id number] Arrival date & time: 07/29/22  1609      History   Chief Complaint Chief Complaint  Patient presents with   Vaginal Itching    HPI Darlene King is a 22 y.o. female.   Patient presents for evaluation of vaginal itching, urinary frequency, urgency, dysuria and a light brown discharge present for 4 to 5 days.  Has seen small amounts of blood on the tissue only when wiping, has never seen on underwear or in toilet.  Sexually active, 5 partners within the last month, no condom use, no known exposures.  Denies abdominal pain or pressure, flank pain, fevers, new rash or lesions.  Has not attempted treatment.  History reviewed. No pertinent past medical history.  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      Home Medications    Prior to Admission medications   Medication Sig Start Date End Date Taking? Authorizing Provider  nicotine polacrilex (NICORETTE) 2 MG gum Take 1 each (2 mg total) by mouth as needed for smoking cessation. 04/19/22  Yes Massengill, Harrold Donath, MD  hydrOXYzine (ATARAX) 25 MG tablet Take 1 tablet (25 mg  total) by mouth 3 (three) times daily as needed for anxiety. 04/19/22   Massengill, Harrold Donath, MD  medroxyPROGESTERone (DEPO-PROVERA) 150 MG/ML injection Inject 150 mg into the muscle every 3 (three) months. 01/30/22   [provider]  traZODone (DESYREL) 100 MG tablet Take 1 tablet (100 mg total) by mouth at bedtime. 04/19/22 05/19/22  Phineas Inches, MD    Family History Family History  Problem Relation Age of Onset   Prostate cancer Paternal Grandfather     Social History Social History   Tobacco Use   Smoking status: Every Day   Smokeless tobacco: Never  Vaping Use   Vaping Use: Every day   Substances: Nicotine  Substance Use Topics   Alcohol use: Yes   Drug use: Yes    Types: Marijuana     Allergies   Patient has no known allergies.   Review of Systems Review of Systems  Constitutional: Negative.   HENT: Negative.    Respiratory: Negative.    Cardiovascular: Negative.   Gastrointestinal: Negative.   Genitourinary:  Positive for dysuria, frequency and urgency. Negative for decreased urine volume, difficulty urinating, dyspareunia, enuresis, flank pain, genital sores, hematuria, menstrual problem, pelvic pain, vaginal bleeding, vaginal discharge and vaginal pain.     Physical Exam Triage Vital Signs ED Triage Vitals [07/29/22 1619]  Enc Vitals Group     BP 116/80     Pulse Rate 95     Resp 16     Temp 99.2 F (37.3  C)     Temp Source Oral     SpO2 98 %     Weight 125 lb (56.7 kg)     Height 5\' 4"  (1.626 m)     Head Circumference      Peak Flow      Pain Score 0     Pain Loc      Pain Edu?      Excl. in GC?    No data found.  Updated Vital Signs BP 116/80 (BP Location: Right Arm)   Pulse 95   Temp 99.2 F (37.3 C) (Oral)   Resp 16   Ht 5\' 4"  (1.626 m)   Wt 125 lb (56.7 kg)   LMP  (LMP Unknown)   SpO2 98%   BMI 21.46 kg/m   Visual Acuity Right Eye Distance:   Left Eye Distance:   Bilateral Distance:    Right Eye Near:   Left Eye  Near:    Bilateral Near:     Physical Exam Constitutional:      Appearance: Normal appearance.  Eyes:     Extraocular Movements: Extraocular movements intact.  Pulmonary:     Effort: Pulmonary effort is normal.  Abdominal:     General: Abdomen is flat. Bowel sounds are normal. There is no distension.     Palpations: Abdomen is soft.     Tenderness: There is no abdominal tenderness. There is no right CVA tenderness, left CVA tenderness or guarding.  Neurological:     Mental Status: She is alert and oriented to person, place, and time. Mental status is at baseline.      UC Treatments / Results  Labs (all labs ordered are listed, but only abnormal results are displayed) Labs Reviewed  POCT URINALYSIS DIP (MANUAL ENTRY) - Abnormal; Notable for the following components:      Result Value   Spec Grav, UA >=1.030 (*)    Blood, UA moderate (*)    Protein Ur, POC trace (*)    Leukocytes, UA Trace (*)    All other components within normal limits  CERVICOVAGINAL ANCILLARY ONLY    EKG   Radiology No results found.  Procedures Procedures (including critical care time)  Medications Ordered in UC Medications - No data to display  Initial Impression / Assessment and Plan / UC Course  I have reviewed the triage vital signs and the nursing notes.  Pertinent labs & imaging results that were available during my care of the patient were reviewed by me and considered in my medical decision making (see chart for details).  Vaginal itching  Urinalysis showing leukocytes and hemoglobin, negative for nitrates, sent for culture as patient is symptomatic, will defer antibiotic use until culture results, symptomology is most consistent with yeast vaginitis, discussed this with patient, light brown discharge also possibly related to upcoming need for Depo-Provera injection as spotting is known side effect, Diflucan prescribed, discussed administration, STI labs are pending, advised abstinence  until lab results, treatment is complete and all symptoms have resolved, patient endorses that she will be returning to [place] tomorrow which is her home, discussed that if testing positive she will need to go to a local urgent care or health department for treatment after notification, verbalized understanding   Final Clinical Impressions(s) / UC Diagnoses   Final diagnoses:  None   Discharge Instructions   None    ED Prescriptions   None    PDMP not reviewed this encounter.   Valinda Hoar, NP  07/29/22 1717  

## 2022-07-29 NOTE — Discharge Instructions (Signed)
Urinalysis today shows some blood and a small amount of Azyiah Bo blood cells but does not show bacteria, it has been sent to the lab to determine if bacteria will grow, if this occurs you will be notified   Based on your symptoms you most likely have a vaginal yeast infection and today we will prophylactically start treatment while we are waiting for your test results  Take 1 Diflucan tablet today and then in 3 days if you are still having symptoms take second dose  Labs pending 2-3 days, you will be contacted if positive for any sti   Please refrain from having sex until labs results, if positive please refrain from having sex until treatment complete and symptoms resolve   If positive for Chlamydia  gonorrhea or trichomoniasis please notify partner or partners so they may tested as well  Moving forward, it is recommended you use some form of protection against the transmission of sti infections  such as condoms or dental dams with each sexual encounter   You will be notified of any positive test results and you will be able to see all test results on your MyChart account, if you are in [place] and are needing treatment due to positive test results you will need to take your results to any of the local urgent cares or to the local health department as we are unable to send medicine to different states

## 2022-07-30 LAB — CERVICOVAGINAL ANCILLARY ONLY
Bacterial Vaginitis (gardnerella): POSITIVE — AB
Candida Glabrata: NEGATIVE
Candida Vaginitis: NEGATIVE
Chlamydia: POSITIVE — AB
Comment: NEGATIVE
Comment: NEGATIVE
Comment: NEGATIVE
Comment: NEGATIVE
Comment: NEGATIVE
Comment: NORMAL
Neisseria Gonorrhea: POSITIVE — AB
Trichomonas: NEGATIVE

## 2022-07-30 LAB — URINE CULTURE: Culture: <10000 — AB

## 2022-07-31 ENCOUNTER — Telehealth (HOSPITAL_COMMUNITY): Payer: Self-pay | Admitting: Emergency Medicine

## 2022-07-31 MED ORDER — DOXYCYCLINE HYCLATE 100 MG PO CAPS: 100.0000 mg | ORAL_CAPSULE | Freq: Two times a day (BID) | ORAL | 0 refills | Status: AC

## 2022-07-31 MED ORDER — DOXYCYCLINE HYCLATE 100 MG PO CAPS: 100.0000 mg | ORAL_CAPSULE | Freq: Two times a day (BID) | ORAL | 0 refills | Status: DC

## 2022-07-31 MED ORDER — METRONIDAZOLE 0.75 % VA GEL: 1.0000 | Freq: Every day | VAGINAL | 0 refills | Status: AC

## 2022-07-31 MED ORDER — METRONIDAZOLE 0.75 % VA GEL: 1.0000 | Freq: Every day | VAGINAL | 0 refills | Status: DC

## 2022-07-31 NOTE — Telephone Encounter (Signed)
Per protocol, patient will need treatment with IM Rocephin 500mg  for positive Gonorrhea. Will also need treatment with Doxycycline and Metrogel Attempted to reach patient x 1, LVM Prescription sent to pharmacy on file [facility] notified

## 2022-07-31 NOTE — Telephone Encounter (Signed)
Resent to pharmacy of patient's request
